# Patient Record
Sex: Female | Born: 1945 | Race: White | Hispanic: No | Marital: Married | Smoking: Never smoker
Health system: Southern US, Community
[De-identification: ages and names within clinical notes are randomized; demographics above are authoritative.]

## PROBLEM LIST (undated history)

## (undated) DIAGNOSIS — M1990 Unspecified osteoarthritis, unspecified site: Secondary | ICD-10-CM

## (undated) DIAGNOSIS — B019 Varicella without complication: Secondary | ICD-10-CM

## (undated) DIAGNOSIS — Z950 Presence of cardiac pacemaker: Secondary | ICD-10-CM

## (undated) DIAGNOSIS — R030 Elevated blood-pressure reading, without diagnosis of hypertension: Secondary | ICD-10-CM

## (undated) DIAGNOSIS — Z789 Other specified health status: Secondary | ICD-10-CM

## (undated) DIAGNOSIS — I441 Atrioventricular block, second degree: Secondary | ICD-10-CM

## (undated) DIAGNOSIS — N951 Menopausal and female climacteric states: Secondary | ICD-10-CM

## (undated) DIAGNOSIS — T7840XA Allergy, unspecified, initial encounter: Secondary | ICD-10-CM

## (undated) HISTORY — PX: OTHER SURGICAL HISTORY: SHX169

## (undated) HISTORY — PX: KNEE ARTHROSCOPY WITH MENISCAL REPAIR: SHX5653

## (undated) HISTORY — PX: APPENDECTOMY: SHX54

## (undated) HISTORY — PX: JOINT REPLACEMENT: SHX530

---

## 1987-07-10 HISTORY — PX: BREAST EXCISIONAL BIOPSY: SUR124

## 2005-01-18 ENCOUNTER — Ambulatory Visit: Payer: Self-pay | Admitting: Internal Medicine

## 2005-07-23 ENCOUNTER — Ambulatory Visit: Payer: Self-pay | Admitting: Internal Medicine

## 2006-03-05 ENCOUNTER — Ambulatory Visit: Payer: Self-pay | Admitting: Internal Medicine

## 2007-03-18 ENCOUNTER — Ambulatory Visit: Payer: Self-pay | Admitting: Internal Medicine

## 2007-09-17 ENCOUNTER — Ambulatory Visit: Payer: Self-pay | Admitting: Internal Medicine

## 2007-11-22 ENCOUNTER — Ambulatory Visit: Payer: Self-pay

## 2008-02-25 ENCOUNTER — Ambulatory Visit: Payer: Self-pay | Admitting: Orthopaedic Surgery

## 2008-02-27 ENCOUNTER — Ambulatory Visit: Payer: Self-pay | Admitting: Orthopaedic Surgery

## 2008-10-07 ENCOUNTER — Ambulatory Visit: Payer: Self-pay | Admitting: Internal Medicine

## 2009-10-12 ENCOUNTER — Ambulatory Visit: Payer: Self-pay | Admitting: Internal Medicine

## 2010-07-06 ENCOUNTER — Ambulatory Visit
Admission: RE | Admit: 2010-07-06 | Discharge: 2010-07-06 | Payer: Self-pay | Source: Home / Self Care | Attending: Family Medicine | Admitting: Family Medicine

## 2010-08-10 NOTE — Assessment & Plan Note (Signed)
Summary: flu shot/jbb  Nurse Visit   Immunizations Administered:  Influenza Vaccine:    Vaccine Type: FLULAVAL    Site: right deltoid    Mfr: GlaxoSmithKline    Dose: 0.5 ml    Route: IM    Given by: Levonne Spiller EMT-P    Exp. Date: 01/06/2011    Lot #: [id number]    VIS given: 01/31/10 version given July 06, 2010.   Immunizations Administered:  Influenza Vaccine:    Vaccine Type: FLULAVAL    Site: right deltoid    Mfr: GlaxoSmithKline    Dose: 0.5 ml    Route: IM    Given by: Levonne Spiller EMT-P    Exp. Date: 01/06/2011    Lot #: [id number]    VIS given: 01/31/10 version given July 06, 2010.  Flu Vaccine Consent Questions:    Do you have a history of severe allergic reactions to this vaccine? no    Any prior history of allergic reactions to egg and/or gelatin? no    Do you have a sensitivity to the preservative Thimersol? no    Do you have a past history of Guillan-Barre Syndrome? no    Do you currently have an acute febrile illness? no    Have you ever had a severe reaction to latex? no    Vaccine information given and explained to patient? yes    Are you currently pregnant? no

## 2010-11-23 ENCOUNTER — Ambulatory Visit: Payer: Self-pay | Admitting: Internal Medicine

## 2011-12-11 ENCOUNTER — Ambulatory Visit: Payer: Self-pay | Admitting: Internal Medicine

## 2011-12-14 ENCOUNTER — Ambulatory Visit: Payer: Self-pay | Admitting: Internal Medicine

## 2013-01-29 ENCOUNTER — Ambulatory Visit: Payer: Self-pay

## 2014-02-11 ENCOUNTER — Ambulatory Visit: Payer: Self-pay

## 2014-04-14 ENCOUNTER — Other Ambulatory Visit: Payer: Self-pay | Admitting: Orthopedic Surgery

## 2014-04-14 DIAGNOSIS — M25562 Pain in left knee: Secondary | ICD-10-CM

## 2014-04-26 ENCOUNTER — Ambulatory Visit
Admission: RE | Admit: 2014-04-26 | Discharge: 2014-04-26 | Disposition: A | Discharge status: Home / Self Care | Payer: Commercial Managed Care - HMO | Source: Ambulatory Visit | Attending: Orthopedic Surgery | Admitting: Orthopedic Surgery

## 2014-04-26 DIAGNOSIS — M25562 Pain in left knee: Secondary | ICD-10-CM

## 2014-08-18 DIAGNOSIS — G8918 Other acute postprocedural pain: Secondary | ICD-10-CM | POA: Diagnosis not present

## 2014-08-18 DIAGNOSIS — M1712 Unilateral primary osteoarthritis, left knee: Secondary | ICD-10-CM | POA: Diagnosis not present

## 2014-08-18 DIAGNOSIS — M179 Osteoarthritis of knee, unspecified: Secondary | ICD-10-CM | POA: Diagnosis not present

## 2014-08-19 DIAGNOSIS — M12869 Other specific arthropathies, not elsewhere classified, unspecified knee: Secondary | ICD-10-CM | POA: Diagnosis not present

## 2014-08-24 DIAGNOSIS — R262 Difficulty in walking, not elsewhere classified: Secondary | ICD-10-CM | POA: Diagnosis not present

## 2014-08-24 DIAGNOSIS — Z471 Aftercare following joint replacement surgery: Secondary | ICD-10-CM | POA: Diagnosis not present

## 2014-08-24 DIAGNOSIS — M25662 Stiffness of left knee, not elsewhere classified: Secondary | ICD-10-CM | POA: Diagnosis not present

## 2014-08-24 DIAGNOSIS — M6281 Muscle weakness (generalized): Secondary | ICD-10-CM | POA: Diagnosis not present

## 2014-08-24 DIAGNOSIS — Z96652 Presence of left artificial knee joint: Secondary | ICD-10-CM | POA: Diagnosis not present

## 2014-08-24 DIAGNOSIS — M25562 Pain in left knee: Secondary | ICD-10-CM | POA: Diagnosis not present

## 2014-08-26 DIAGNOSIS — R262 Difficulty in walking, not elsewhere classified: Secondary | ICD-10-CM | POA: Diagnosis not present

## 2014-08-26 DIAGNOSIS — M25662 Stiffness of left knee, not elsewhere classified: Secondary | ICD-10-CM | POA: Diagnosis not present

## 2014-08-26 DIAGNOSIS — M25562 Pain in left knee: Secondary | ICD-10-CM | POA: Diagnosis not present

## 2014-08-26 DIAGNOSIS — M6281 Muscle weakness (generalized): Secondary | ICD-10-CM | POA: Diagnosis not present

## 2014-08-30 DIAGNOSIS — M25662 Stiffness of left knee, not elsewhere classified: Secondary | ICD-10-CM | POA: Diagnosis not present

## 2014-08-30 DIAGNOSIS — M25562 Pain in left knee: Secondary | ICD-10-CM | POA: Diagnosis not present

## 2014-08-30 DIAGNOSIS — R262 Difficulty in walking, not elsewhere classified: Secondary | ICD-10-CM | POA: Diagnosis not present

## 2014-08-30 DIAGNOSIS — M6281 Muscle weakness (generalized): Secondary | ICD-10-CM | POA: Diagnosis not present

## 2014-09-01 DIAGNOSIS — M25562 Pain in left knee: Secondary | ICD-10-CM | POA: Diagnosis not present

## 2014-09-01 DIAGNOSIS — M25662 Stiffness of left knee, not elsewhere classified: Secondary | ICD-10-CM | POA: Diagnosis not present

## 2014-09-01 DIAGNOSIS — M6281 Muscle weakness (generalized): Secondary | ICD-10-CM | POA: Diagnosis not present

## 2014-09-01 DIAGNOSIS — R262 Difficulty in walking, not elsewhere classified: Secondary | ICD-10-CM | POA: Diagnosis not present

## 2014-09-02 DIAGNOSIS — R262 Difficulty in walking, not elsewhere classified: Secondary | ICD-10-CM | POA: Diagnosis not present

## 2014-09-02 DIAGNOSIS — M6281 Muscle weakness (generalized): Secondary | ICD-10-CM | POA: Diagnosis not present

## 2014-09-02 DIAGNOSIS — M25562 Pain in left knee: Secondary | ICD-10-CM | POA: Diagnosis not present

## 2014-09-02 DIAGNOSIS — M25662 Stiffness of left knee, not elsewhere classified: Secondary | ICD-10-CM | POA: Diagnosis not present

## 2014-09-07 DIAGNOSIS — Z96652 Presence of left artificial knee joint: Secondary | ICD-10-CM | POA: Diagnosis not present

## 2014-09-07 DIAGNOSIS — M25662 Stiffness of left knee, not elsewhere classified: Secondary | ICD-10-CM | POA: Diagnosis not present

## 2014-09-07 DIAGNOSIS — M25562 Pain in left knee: Secondary | ICD-10-CM | POA: Diagnosis not present

## 2014-09-07 DIAGNOSIS — M6281 Muscle weakness (generalized): Secondary | ICD-10-CM | POA: Diagnosis not present

## 2014-09-07 DIAGNOSIS — R262 Difficulty in walking, not elsewhere classified: Secondary | ICD-10-CM | POA: Diagnosis not present

## 2014-09-08 DIAGNOSIS — M6281 Muscle weakness (generalized): Secondary | ICD-10-CM | POA: Diagnosis not present

## 2014-09-08 DIAGNOSIS — Z96652 Presence of left artificial knee joint: Secondary | ICD-10-CM | POA: Diagnosis not present

## 2014-09-08 DIAGNOSIS — R262 Difficulty in walking, not elsewhere classified: Secondary | ICD-10-CM | POA: Diagnosis not present

## 2014-09-08 DIAGNOSIS — M25662 Stiffness of left knee, not elsewhere classified: Secondary | ICD-10-CM | POA: Diagnosis not present

## 2014-09-08 DIAGNOSIS — M25562 Pain in left knee: Secondary | ICD-10-CM | POA: Diagnosis not present

## 2014-09-09 DIAGNOSIS — M25662 Stiffness of left knee, not elsewhere classified: Secondary | ICD-10-CM | POA: Diagnosis not present

## 2014-09-09 DIAGNOSIS — R262 Difficulty in walking, not elsewhere classified: Secondary | ICD-10-CM | POA: Diagnosis not present

## 2014-09-09 DIAGNOSIS — M25562 Pain in left knee: Secondary | ICD-10-CM | POA: Diagnosis not present

## 2014-09-09 DIAGNOSIS — M6281 Muscle weakness (generalized): Secondary | ICD-10-CM | POA: Diagnosis not present

## 2014-09-14 DIAGNOSIS — R262 Difficulty in walking, not elsewhere classified: Secondary | ICD-10-CM | POA: Diagnosis not present

## 2014-09-14 DIAGNOSIS — Z96652 Presence of left artificial knee joint: Secondary | ICD-10-CM | POA: Diagnosis not present

## 2014-09-14 DIAGNOSIS — M25662 Stiffness of left knee, not elsewhere classified: Secondary | ICD-10-CM | POA: Diagnosis not present

## 2014-09-14 DIAGNOSIS — M25562 Pain in left knee: Secondary | ICD-10-CM | POA: Diagnosis not present

## 2014-09-16 DIAGNOSIS — M6281 Muscle weakness (generalized): Secondary | ICD-10-CM | POA: Diagnosis not present

## 2014-09-16 DIAGNOSIS — M25662 Stiffness of left knee, not elsewhere classified: Secondary | ICD-10-CM | POA: Diagnosis not present

## 2014-09-16 DIAGNOSIS — R262 Difficulty in walking, not elsewhere classified: Secondary | ICD-10-CM | POA: Diagnosis not present

## 2014-09-16 DIAGNOSIS — M25562 Pain in left knee: Secondary | ICD-10-CM | POA: Diagnosis not present

## 2014-11-09 DIAGNOSIS — N952 Postmenopausal atrophic vaginitis: Secondary | ICD-10-CM | POA: Diagnosis not present

## 2014-11-09 DIAGNOSIS — M810 Age-related osteoporosis without current pathological fracture: Secondary | ICD-10-CM | POA: Diagnosis not present

## 2014-11-09 DIAGNOSIS — R030 Elevated blood-pressure reading, without diagnosis of hypertension: Secondary | ICD-10-CM | POA: Diagnosis not present

## 2014-12-21 DIAGNOSIS — Z96652 Presence of left artificial knee joint: Secondary | ICD-10-CM | POA: Diagnosis not present

## 2015-04-15 DIAGNOSIS — Z23 Encounter for immunization: Secondary | ICD-10-CM | POA: Diagnosis not present

## 2015-05-05 DIAGNOSIS — Z0000 Encounter for general adult medical examination without abnormal findings: Secondary | ICD-10-CM | POA: Diagnosis not present

## 2015-05-05 DIAGNOSIS — R030 Elevated blood-pressure reading, without diagnosis of hypertension: Secondary | ICD-10-CM | POA: Diagnosis not present

## 2015-05-06 ENCOUNTER — Other Ambulatory Visit: Payer: Self-pay | Admitting: Infectious Diseases

## 2015-05-06 ENCOUNTER — Other Ambulatory Visit: Payer: Self-pay | Admitting: Internal Medicine

## 2015-05-06 DIAGNOSIS — Z1231 Encounter for screening mammogram for malignant neoplasm of breast: Secondary | ICD-10-CM

## 2015-05-12 DIAGNOSIS — Z1239 Encounter for other screening for malignant neoplasm of breast: Secondary | ICD-10-CM | POA: Diagnosis not present

## 2015-05-12 DIAGNOSIS — Z0000 Encounter for general adult medical examination without abnormal findings: Secondary | ICD-10-CM | POA: Diagnosis not present

## 2015-05-12 DIAGNOSIS — M810 Age-related osteoporosis without current pathological fracture: Secondary | ICD-10-CM | POA: Diagnosis not present

## 2015-05-17 DIAGNOSIS — H5210 Myopia, unspecified eye: Secondary | ICD-10-CM | POA: Diagnosis not present

## 2015-05-17 DIAGNOSIS — H2513 Age-related nuclear cataract, bilateral: Secondary | ICD-10-CM | POA: Diagnosis not present

## 2015-05-17 DIAGNOSIS — H5200 Hypermetropia, unspecified eye: Secondary | ICD-10-CM | POA: Diagnosis not present

## 2015-05-19 ENCOUNTER — Other Ambulatory Visit: Payer: Self-pay | Admitting: Internal Medicine

## 2015-05-19 ENCOUNTER — Ambulatory Visit
Admission: RE | Admit: 2015-05-19 | Discharge: 2015-05-19 | Disposition: A | Discharge status: Home / Self Care | Payer: Commercial Managed Care - HMO | Source: Ambulatory Visit | Attending: Internal Medicine | Admitting: Internal Medicine

## 2015-05-19 DIAGNOSIS — Z1231 Encounter for screening mammogram for malignant neoplasm of breast: Secondary | ICD-10-CM | POA: Diagnosis not present

## 2015-05-27 DIAGNOSIS — Z0000 Encounter for general adult medical examination without abnormal findings: Secondary | ICD-10-CM | POA: Diagnosis not present

## 2015-08-16 DIAGNOSIS — Z96652 Presence of left artificial knee joint: Secondary | ICD-10-CM | POA: Diagnosis not present

## 2015-08-16 DIAGNOSIS — M25462 Effusion, left knee: Secondary | ICD-10-CM | POA: Diagnosis not present

## 2015-08-16 DIAGNOSIS — Z471 Aftercare following joint replacement surgery: Secondary | ICD-10-CM | POA: Diagnosis not present

## 2015-11-10 DIAGNOSIS — M810 Age-related osteoporosis without current pathological fracture: Secondary | ICD-10-CM | POA: Diagnosis not present

## 2015-11-10 DIAGNOSIS — Z13220 Encounter for screening for lipoid disorders: Secondary | ICD-10-CM | POA: Diagnosis not present

## 2016-04-04 ENCOUNTER — Other Ambulatory Visit: Payer: Self-pay | Admitting: Internal Medicine

## 2016-04-04 DIAGNOSIS — Z1231 Encounter for screening mammogram for malignant neoplasm of breast: Secondary | ICD-10-CM

## 2016-05-04 IMAGING — MG MM SCREENING BREAST TOMO BILATERAL
9 of 12 series · 9 of 28 positions shown · non-contrast
Comparison: Previous exam(s).

CLINICAL DATA: Screening.

EXAM:
DIGITAL SCREENING BILATERAL MAMMOGRAM WITH 3D TOMO WITH CAD

[L MLO]
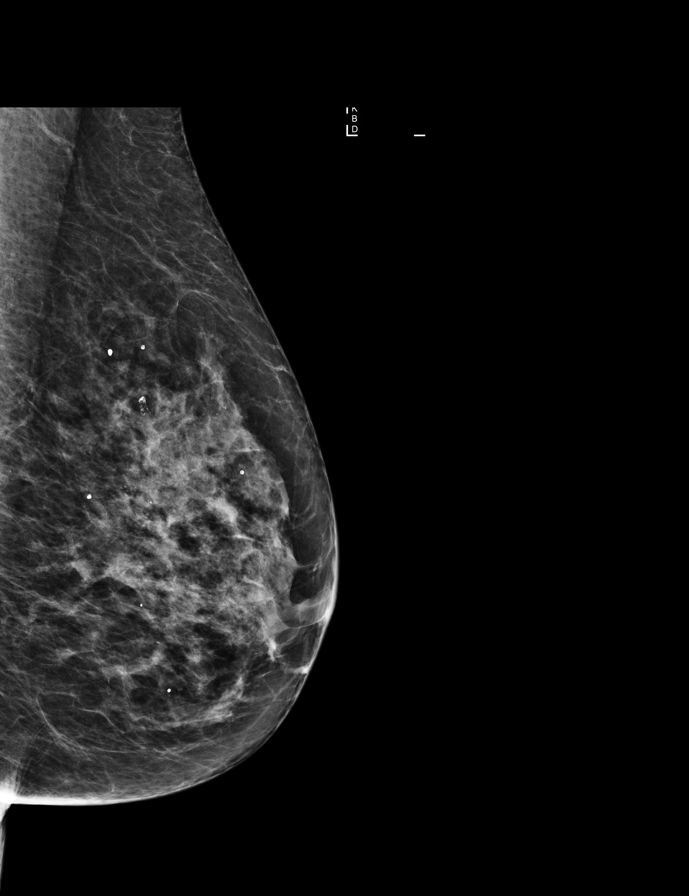

[R CC synth-2D]
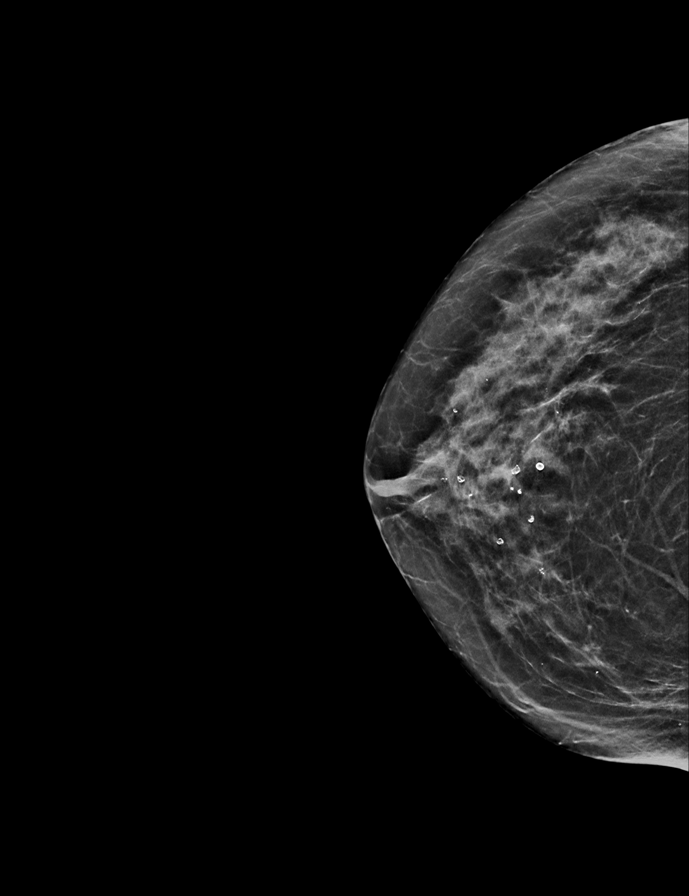

[L CC synth-2D]
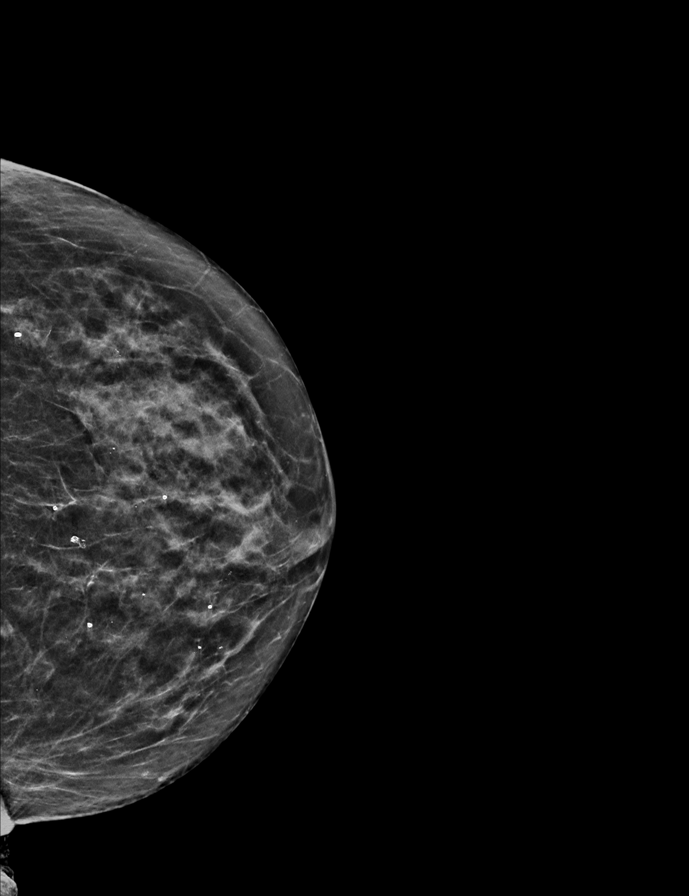

[L MLO synth-2D]
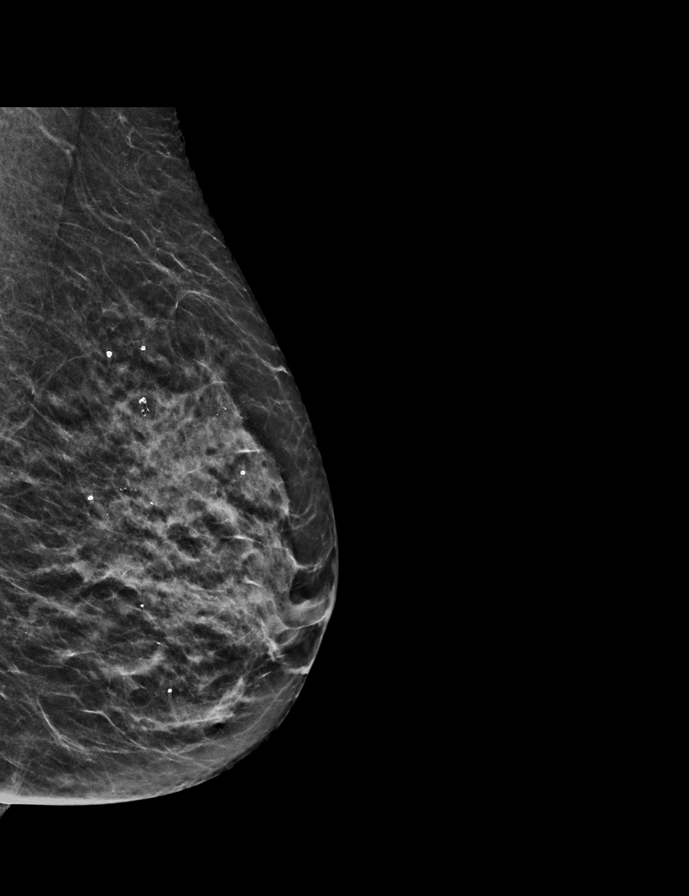

[R MLO synth-2D]
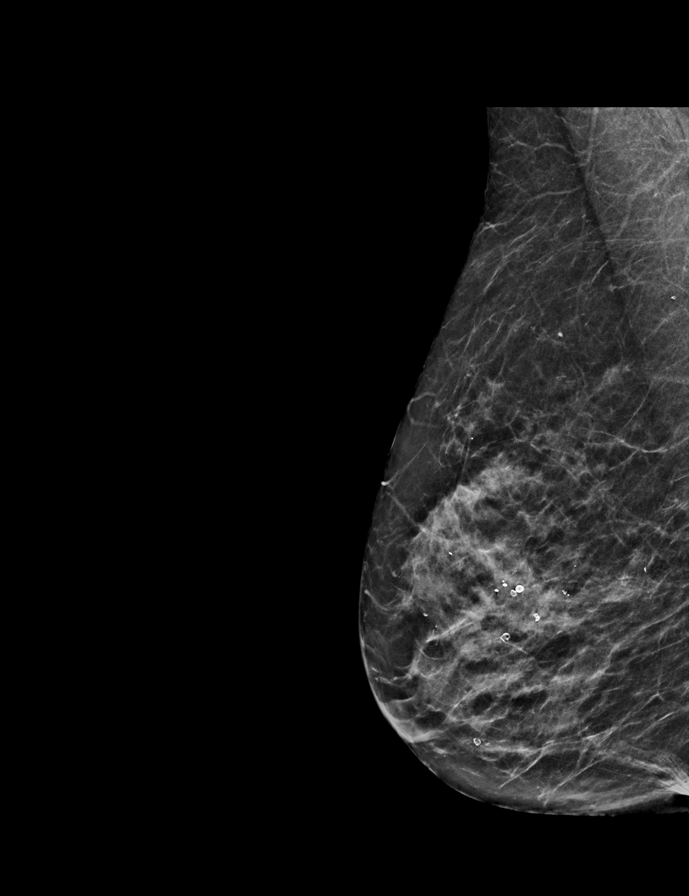

[L CC]
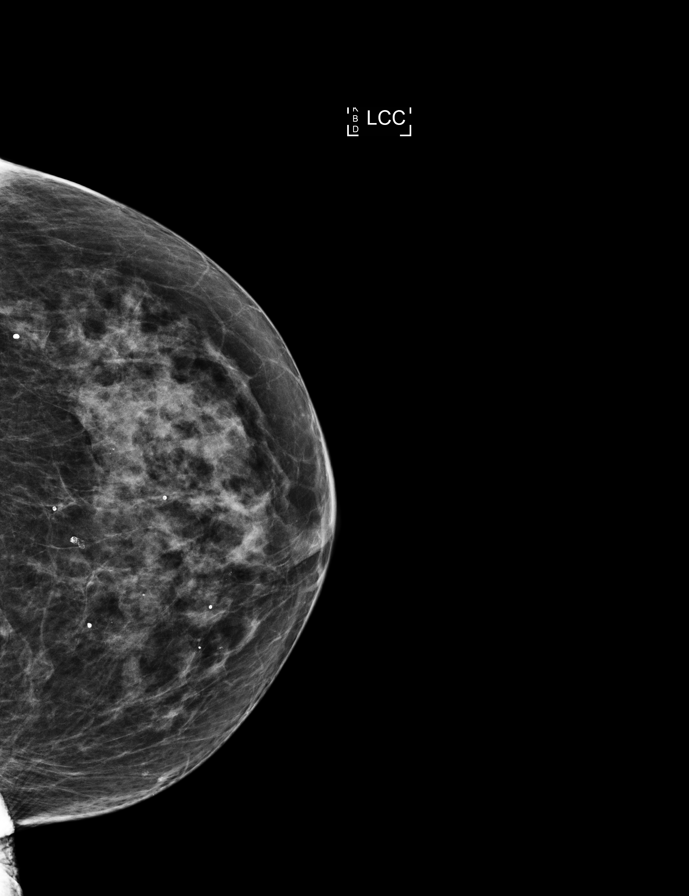

[R CC]
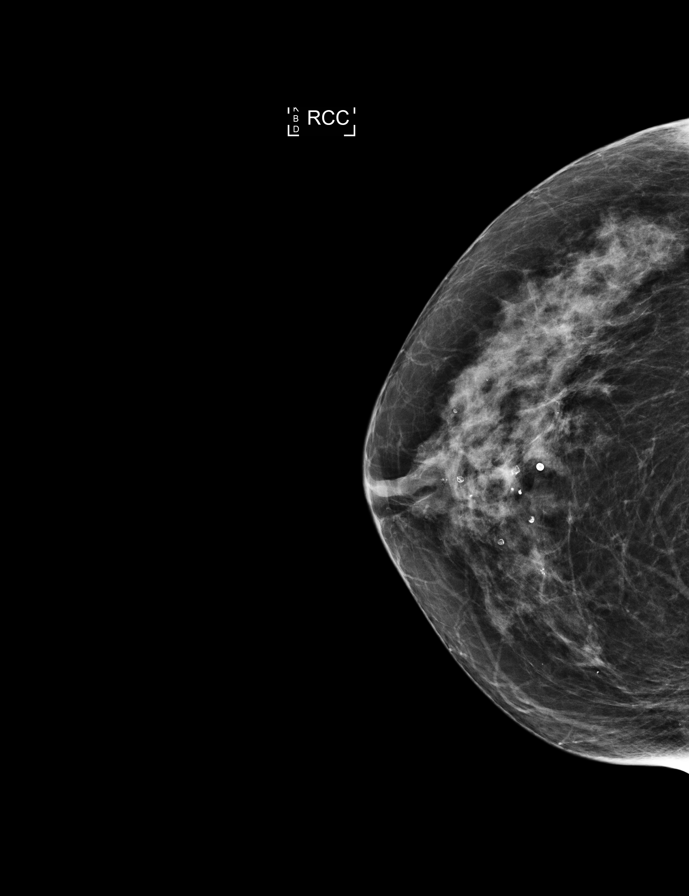

[R MLO]
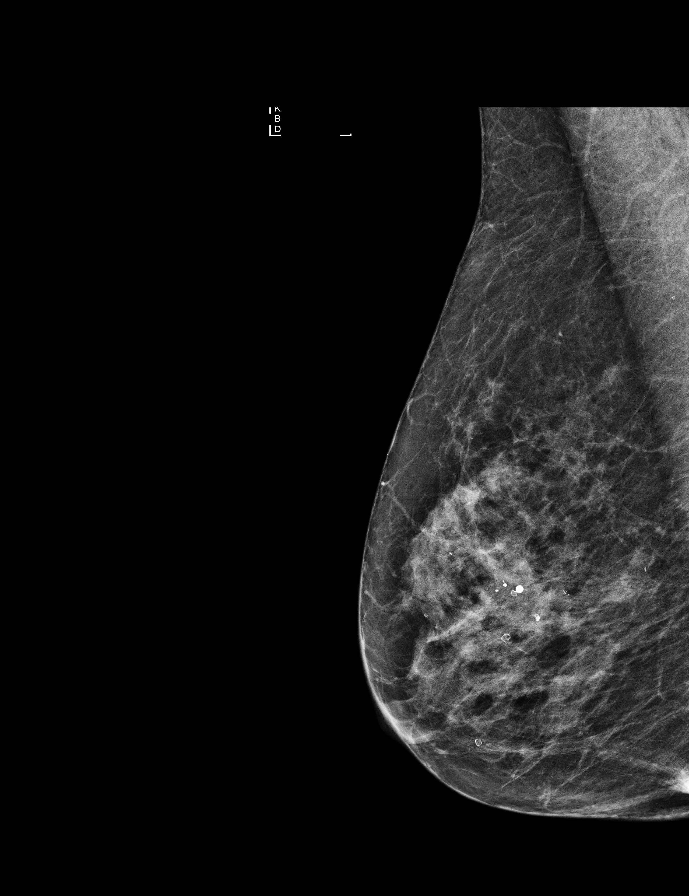

[L MLO tomo · tomo slice 21/42.0]
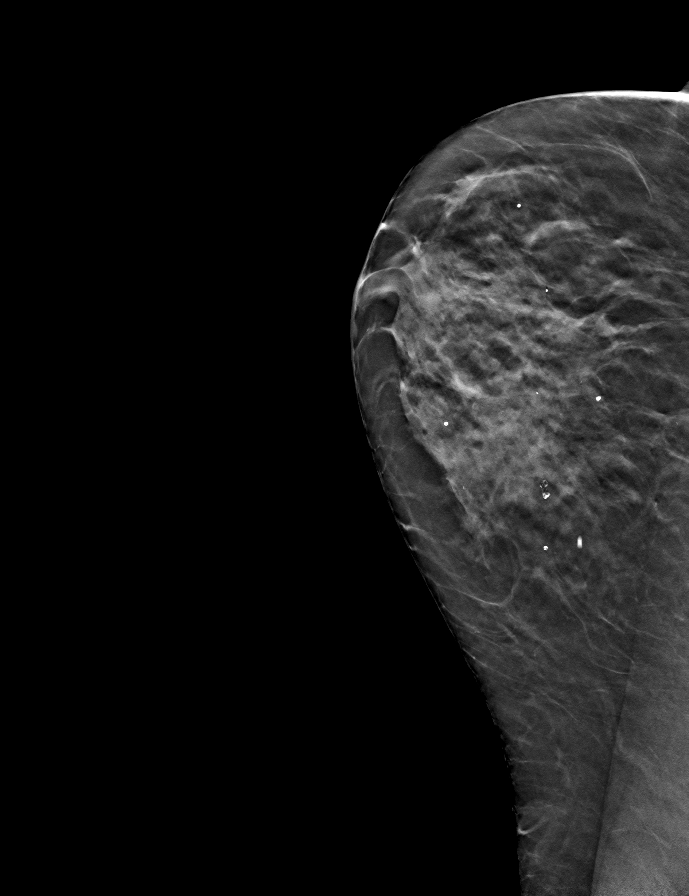

[9 of 28 positions shown; findings below may reference images not displayed]

ACR Breast Density Category c: The breast tissue is heterogeneously
dense, which may obscure small masses.
FINDINGS: There are no findings suspicious for malignancy. Images were
processed with CAD.
IMPRESSION: No mammographic evidence of malignancy. A result letter of this
screening mammogram will be mailed directly to the patient.

RECOMMENDATION:
Screening mammogram in one year. (Code:OA-G-1SS)

BI-RADS CATEGORY  1: Negative.

## 2016-05-10 DIAGNOSIS — Z13220 Encounter for screening for lipoid disorders: Secondary | ICD-10-CM | POA: Diagnosis not present

## 2016-05-10 DIAGNOSIS — M810 Age-related osteoporosis without current pathological fracture: Secondary | ICD-10-CM | POA: Diagnosis not present

## 2016-05-17 DIAGNOSIS — Z0000 Encounter for general adult medical examination without abnormal findings: Secondary | ICD-10-CM | POA: Diagnosis not present

## 2016-05-17 DIAGNOSIS — Z23 Encounter for immunization: Secondary | ICD-10-CM | POA: Diagnosis not present

## 2016-05-17 DIAGNOSIS — Z1211 Encounter for screening for malignant neoplasm of colon: Secondary | ICD-10-CM | POA: Diagnosis not present

## 2016-05-17 DIAGNOSIS — M810 Age-related osteoporosis without current pathological fracture: Secondary | ICD-10-CM | POA: Diagnosis not present

## 2016-05-17 DIAGNOSIS — Z124 Encounter for screening for malignant neoplasm of cervix: Secondary | ICD-10-CM | POA: Diagnosis not present

## 2016-05-24 ENCOUNTER — Ambulatory Visit
Admission: RE | Admit: 2016-05-24 | Discharge: 2016-05-24 | Disposition: A | Discharge status: Home / Self Care | Payer: Commercial Managed Care - HMO | Source: Ambulatory Visit | Attending: Internal Medicine | Admitting: Internal Medicine

## 2016-05-24 DIAGNOSIS — Z1231 Encounter for screening mammogram for malignant neoplasm of breast: Secondary | ICD-10-CM | POA: Insufficient documentation

## 2016-06-04 DIAGNOSIS — M810 Age-related osteoporosis without current pathological fracture: Secondary | ICD-10-CM | POA: Diagnosis not present

## 2016-06-04 DIAGNOSIS — Z0000 Encounter for general adult medical examination without abnormal findings: Secondary | ICD-10-CM | POA: Diagnosis not present

## 2016-06-04 DIAGNOSIS — Z1211 Encounter for screening for malignant neoplasm of colon: Secondary | ICD-10-CM | POA: Diagnosis not present

## 2016-06-04 DIAGNOSIS — Z124 Encounter for screening for malignant neoplasm of cervix: Secondary | ICD-10-CM | POA: Diagnosis not present

## 2016-06-04 DIAGNOSIS — Z23 Encounter for immunization: Secondary | ICD-10-CM | POA: Diagnosis not present

## 2016-06-06 DIAGNOSIS — M810 Age-related osteoporosis without current pathological fracture: Secondary | ICD-10-CM | POA: Diagnosis not present

## 2016-08-16 DIAGNOSIS — Z0000 Encounter for general adult medical examination without abnormal findings: Secondary | ICD-10-CM | POA: Diagnosis not present

## 2016-08-16 DIAGNOSIS — R030 Elevated blood-pressure reading, without diagnosis of hypertension: Secondary | ICD-10-CM | POA: Diagnosis not present

## 2016-08-16 DIAGNOSIS — M810 Age-related osteoporosis without current pathological fracture: Secondary | ICD-10-CM | POA: Diagnosis not present

## 2016-09-20 DIAGNOSIS — H2513 Age-related nuclear cataract, bilateral: Secondary | ICD-10-CM | POA: Diagnosis not present

## 2016-09-20 DIAGNOSIS — Z0100 Encounter for examination of eyes and vision without abnormal findings: Secondary | ICD-10-CM | POA: Diagnosis not present

## 2016-11-29 DIAGNOSIS — M810 Age-related osteoporosis without current pathological fracture: Secondary | ICD-10-CM | POA: Diagnosis not present

## 2016-11-29 DIAGNOSIS — R030 Elevated blood-pressure reading, without diagnosis of hypertension: Secondary | ICD-10-CM | POA: Diagnosis not present

## 2016-11-29 DIAGNOSIS — Z131 Encounter for screening for diabetes mellitus: Secondary | ICD-10-CM | POA: Diagnosis not present

## 2017-04-10 ENCOUNTER — Other Ambulatory Visit: Payer: Self-pay | Admitting: Internal Medicine

## 2017-04-10 DIAGNOSIS — Z1231 Encounter for screening mammogram for malignant neoplasm of breast: Secondary | ICD-10-CM

## 2017-05-27 ENCOUNTER — Ambulatory Visit
Admission: RE | Admit: 2017-05-27 | Discharge: 2017-05-27 | Disposition: A | Discharge status: Home / Self Care | Payer: Commercial Managed Care - HMO | Source: Ambulatory Visit | Attending: Internal Medicine | Admitting: Internal Medicine

## 2017-05-27 DIAGNOSIS — R030 Elevated blood-pressure reading, without diagnosis of hypertension: Secondary | ICD-10-CM | POA: Diagnosis not present

## 2017-05-27 DIAGNOSIS — M810 Age-related osteoporosis without current pathological fracture: Secondary | ICD-10-CM | POA: Diagnosis not present

## 2017-05-27 DIAGNOSIS — Z1231 Encounter for screening mammogram for malignant neoplasm of breast: Secondary | ICD-10-CM

## 2017-05-27 DIAGNOSIS — Z131 Encounter for screening for diabetes mellitus: Secondary | ICD-10-CM | POA: Diagnosis not present

## 2017-06-03 DIAGNOSIS — Z0000 Encounter for general adult medical examination without abnormal findings: Secondary | ICD-10-CM | POA: Diagnosis not present

## 2017-07-15 DIAGNOSIS — H52223 Regular astigmatism, bilateral: Secondary | ICD-10-CM | POA: Diagnosis not present

## 2017-07-15 DIAGNOSIS — H524 Presbyopia: Secondary | ICD-10-CM | POA: Diagnosis not present

## 2017-07-15 DIAGNOSIS — H5203 Hypermetropia, bilateral: Secondary | ICD-10-CM | POA: Diagnosis not present

## 2017-07-15 DIAGNOSIS — H2513 Age-related nuclear cataract, bilateral: Secondary | ICD-10-CM | POA: Diagnosis not present

## 2017-07-16 DIAGNOSIS — Z0100 Encounter for examination of eyes and vision without abnormal findings: Secondary | ICD-10-CM | POA: Diagnosis not present

## 2018-04-14 ENCOUNTER — Other Ambulatory Visit: Payer: Self-pay | Admitting: Internal Medicine

## 2018-04-14 DIAGNOSIS — Z1231 Encounter for screening mammogram for malignant neoplasm of breast: Secondary | ICD-10-CM

## 2018-05-29 ENCOUNTER — Ambulatory Visit
Admission: RE | Admit: 2018-05-29 | Discharge: 2018-05-29 | Disposition: A | Discharge status: Home / Self Care | Payer: Medicare HMO | Source: Ambulatory Visit | Attending: Internal Medicine | Admitting: Internal Medicine

## 2018-05-29 DIAGNOSIS — Z1231 Encounter for screening mammogram for malignant neoplasm of breast: Secondary | ICD-10-CM | POA: Insufficient documentation

## 2018-07-07 DIAGNOSIS — R030 Elevated blood-pressure reading, without diagnosis of hypertension: Secondary | ICD-10-CM | POA: Diagnosis not present

## 2018-07-07 DIAGNOSIS — Z0000 Encounter for general adult medical examination without abnormal findings: Secondary | ICD-10-CM | POA: Diagnosis not present

## 2018-07-07 DIAGNOSIS — Z13220 Encounter for screening for lipoid disorders: Secondary | ICD-10-CM | POA: Diagnosis not present

## 2018-07-14 DIAGNOSIS — Z23 Encounter for immunization: Secondary | ICD-10-CM | POA: Diagnosis not present

## 2018-07-14 DIAGNOSIS — R030 Elevated blood-pressure reading, without diagnosis of hypertension: Secondary | ICD-10-CM | POA: Diagnosis not present

## 2018-07-14 DIAGNOSIS — Z0000 Encounter for general adult medical examination without abnormal findings: Secondary | ICD-10-CM | POA: Diagnosis not present

## 2018-08-14 DIAGNOSIS — R030 Elevated blood-pressure reading, without diagnosis of hypertension: Secondary | ICD-10-CM | POA: Diagnosis not present

## 2018-08-29 DIAGNOSIS — H5203 Hypermetropia, bilateral: Secondary | ICD-10-CM | POA: Diagnosis not present

## 2018-08-29 DIAGNOSIS — H524 Presbyopia: Secondary | ICD-10-CM | POA: Diagnosis not present

## 2018-08-29 DIAGNOSIS — H2513 Age-related nuclear cataract, bilateral: Secondary | ICD-10-CM | POA: Diagnosis not present

## 2018-12-16 ENCOUNTER — Inpatient Hospital Stay
Admit: 2018-12-16 | Discharge: 2018-12-16 | Disposition: A | Discharge status: Home / Self Care | Payer: Medicare HMO | Attending: Cardiovascular Disease | Admitting: Cardiovascular Disease

## 2018-12-16 ENCOUNTER — Inpatient Hospital Stay
Admission: EM | Admit: 2018-12-16 | Discharge: 2018-12-19 | DRG: 244 | Disposition: A | Discharge status: Home / Self Care | Payer: Medicare HMO | Attending: Internal Medicine | Admitting: Internal Medicine

## 2018-12-16 ENCOUNTER — Encounter: Payer: Self-pay | Admitting: Medical Oncology

## 2018-12-16 ENCOUNTER — Other Ambulatory Visit: Payer: Self-pay

## 2018-12-16 DIAGNOSIS — I459 Conduction disorder, unspecified: Secondary | ICD-10-CM

## 2018-12-16 DIAGNOSIS — M1990 Unspecified osteoarthritis, unspecified site: Secondary | ICD-10-CM | POA: Diagnosis present

## 2018-12-16 DIAGNOSIS — Z9861 Coronary angioplasty status: Secondary | ICD-10-CM | POA: Diagnosis not present

## 2018-12-16 DIAGNOSIS — I441 Atrioventricular block, second degree: Secondary | ICD-10-CM | POA: Diagnosis not present

## 2018-12-16 DIAGNOSIS — Z882 Allergy status to sulfonamides status: Secondary | ICD-10-CM

## 2018-12-16 DIAGNOSIS — R918 Other nonspecific abnormal finding of lung field: Secondary | ICD-10-CM | POA: Diagnosis not present

## 2018-12-16 DIAGNOSIS — Z950 Presence of cardiac pacemaker: Secondary | ICD-10-CM

## 2018-12-16 DIAGNOSIS — Z03818 Encounter for observation for suspected exposure to other biological agents ruled out: Secondary | ICD-10-CM | POA: Diagnosis not present

## 2018-12-16 DIAGNOSIS — R030 Elevated blood-pressure reading, without diagnosis of hypertension: Secondary | ICD-10-CM | POA: Diagnosis not present

## 2018-12-16 DIAGNOSIS — Z1159 Encounter for screening for other viral diseases: Secondary | ICD-10-CM

## 2018-12-16 DIAGNOSIS — M810 Age-related osteoporosis without current pathological fracture: Secondary | ICD-10-CM | POA: Diagnosis not present

## 2018-12-16 DIAGNOSIS — I499 Cardiac arrhythmia, unspecified: Secondary | ICD-10-CM | POA: Diagnosis not present

## 2018-12-16 DIAGNOSIS — I10 Essential (primary) hypertension: Secondary | ICD-10-CM | POA: Diagnosis not present

## 2018-12-16 DIAGNOSIS — R0989 Other specified symptoms and signs involving the circulatory and respiratory systems: Secondary | ICD-10-CM | POA: Diagnosis not present

## 2018-12-16 DIAGNOSIS — R531 Weakness: Secondary | ICD-10-CM | POA: Diagnosis not present

## 2018-12-16 DIAGNOSIS — R9431 Abnormal electrocardiogram [ECG] [EKG]: Secondary | ICD-10-CM | POA: Diagnosis not present

## 2018-12-16 HISTORY — DX: Other specified health status: Z78.9

## 2018-12-16 LAB — CBC
HCT: 45.2 % (ref 36.0–46.0)
Hemoglobin: 15.3 g/dL — ABNORMAL HIGH (ref 12.0–15.0)
MCH: 32 pg (ref 26.0–34.0)
MCHC: 33.8 g/dL (ref 30.0–36.0)
MCV: 94.6 fL (ref 80.0–100.0)
Platelets: 320 10*3/uL (ref 150–400)
RBC: 4.78 MIL/uL (ref 3.87–5.11)
RDW: 11.8 % (ref 11.5–15.5)
WBC: 10.3 10*3/uL (ref 4.0–10.5)
nRBC: 0 % (ref 0.0–0.2)

## 2018-12-16 LAB — BASIC METABOLIC PANEL
Anion gap: 10 (ref 5–15)
BUN: 15 mg/dL (ref 8–23)
CO2: 25 mmol/L (ref 22–32)
Calcium: 9.5 mg/dL (ref 8.9–10.3)
Chloride: 106 mmol/L (ref 98–111)
Creatinine, Ser: 0.77 mg/dL (ref 0.44–1.00)
GFR calc Af Amer: >60 mL/min (ref >60)
GFR calc non Af Amer: >60 mL/min (ref >60)
Glucose, Bld: 112 mg/dL — ABNORMAL HIGH (ref 70–99)
Potassium: 4 mmol/L (ref 3.5–5.1)
Sodium: 141 mmol/L (ref 135–145)

## 2018-12-16 LAB — TROPONIN I
Troponin I: <0.03 ng/mL (ref <0.03)
Troponin I: <0.03 ng/mL (ref <0.03)

## 2018-12-16 LAB — MAGNESIUM: Magnesium: 2.5 mg/dL — ABNORMAL HIGH (ref 1.7–2.4)

## 2018-12-16 LAB — TSH: TSH: 2.946 u[IU]/mL (ref 0.350–4.500)

## 2018-12-16 MED ORDER — ALBUTEROL SULFATE (2.5 MG/3ML) 0.083% IN NEBU: 2.5000 mg | INHALATION_SOLUTION | Freq: Four times a day (QID) | RESPIRATORY_TRACT | Status: DC | PRN

## 2018-12-16 MED ORDER — ALBUTEROL SULFATE (2.5 MG/3ML) 0.083% IN NEBU
2.5000 mg | INHALATION_SOLUTION | Freq: Four times a day (QID) | RESPIRATORY_TRACT | Status: DC
  Filled 2018-12-16: qty 3

## 2018-12-16 MED ORDER — ACETAMINOPHEN 325 MG PO TABS: 650.0000 mg | ORAL_TABLET | Freq: Four times a day (QID) | ORAL | Status: DC | PRN

## 2018-12-16 MED ORDER — SODIUM CHLORIDE 0.9% FLUSH
3.0000 mL | Freq: Two times a day (BID) | INTRAVENOUS | Status: DC
  Administered 2018-12-16 – 2018-12-18 (×5): 3 mL via INTRAVENOUS

## 2018-12-16 MED ORDER — NITROGLYCERIN 0.4 MG SL SUBL: 0.4000 mg | SUBLINGUAL_TABLET | SUBLINGUAL | Status: DC | PRN

## 2018-12-16 MED ORDER — SODIUM CHLORIDE 0.9 % IV SOLN
250.0000 mL | INTRAVENOUS | Status: DC | PRN
  Administered 2018-12-18: 12:00:00 via INTRAVENOUS
  Administered 2018-12-19 (×2): 250 mL via INTRAVENOUS

## 2018-12-16 MED ORDER — SODIUM CHLORIDE 0.9% FLUSH: 3.0000 mL | Freq: Once | INTRAVENOUS | Status: DC

## 2018-12-16 MED ORDER — ACETAMINOPHEN 650 MG RE SUPP: 650.0000 mg | Freq: Four times a day (QID) | RECTAL | Status: DC | PRN

## 2018-12-16 MED ORDER — ENOXAPARIN SODIUM 40 MG/0.4ML ~~LOC~~ SOLN
40.0000 mg | SUBCUTANEOUS | Status: DC
  Administered 2018-12-16 – 2018-12-18 (×3): 40 mg via SUBCUTANEOUS
  Filled 2018-12-16 (×3): qty 0.4

## 2018-12-16 MED ORDER — POLYETHYLENE GLYCOL 3350 17 G PO PACK: 17.0000 g | PACK | Freq: Every day | ORAL | Status: DC | PRN

## 2018-12-16 MED ORDER — SODIUM CHLORIDE 0.9% FLUSH: 3.0000 mL | INTRAVENOUS | Status: DC | PRN

## 2018-12-16 NOTE — ED Provider Notes (Signed)
[facility] Emergency Department Provider Note ____________________________________________   First MD Initiated Contact with Patient 12/16/18 1508     (approximate)  I have reviewed the triage vital signs and the nursing notes.  HISTORY  Chief Complaint Irregular Heart Beat  HPI LELER BRION is a 73 y.o. female here for evaluation for feeling weak  Since Sunday has been feeling tired.  Noticed on her heart monitor at home that her heart rates been running low.  She feels like her heartbeat will be irregular at times and she will feel tired.  Despite this she was able to mow the lawn today without issue, except just felt more tired than normal.  No feel like she is got a pass out.  No nausea vomiting.  No chest pain no trouble breathing.  No recent infection.  No cough no fevers no chills.  She feels well except when the heart feels irregular she feels weak   No personal history heart disease  History reviewed. No pertinent past medical history.  There are no active problems to display for this patient.   Past Surgical History:  Procedure Laterality Date  . BREAST EXCISIONAL BIOPSY Right 1989    Prior to Admission medications   Not on File    Allergies Sulfa antibiotics  Family History  Problem Relation Age of Onset  . Breast cancer Neg Hx     Social History Social History   Tobacco Use  . Smoking status: Not on file  Substance Use Topics  . Alcohol use: Not on file  . Drug use: Not on file  No illicit drug use Non-smoker  Review of Systems Constitutional: No fever/chills Eyes: No visual changes. ENT: No sore throat. Cardiovascular: Denies chest pain. Respiratory: Denies shortness of breath. Gastrointestinal: No abdominal pain.   Genitourinary: Negative for dysuria. Musculoskeletal: Negative for back pain. Skin: Negative for rash. Neurological: Negative for headaches, areas of focal weakness or numbness.     ____________________________________________   PHYSICAL EXAM:  VITAL SIGNS: ED Triage Vitals [12/16/18 1428]  Enc Vitals Group     BP (!) 176/62     Pulse Rate 79     Resp 16     Temp 98.5 F (36.9 C)     Temp Source Oral     SpO2 97 %     Weight 109 lb (49.4 kg)     Height 4\' 11"  (1.499 m)     Head Circumference      Peak Flow      Pain Score 0     Pain Loc      Pain Edu?      Excl. in [place]?     Constitutional: Alert and oriented. Well appearing and in no acute distress. Eyes: Conjunctivae are normal. Head: Atraumatic. Nose: No congestion/rhinnorhea. Mouth/Throat: Mucous membranes are moist. Neck: No stridor.  Cardiovascular: Slightly irregular rate, irregular rhythm. Grossly normal heart sounds.  Good peripheral circulation.  Heart rate approximately 40 by palpation, electrically approximately 70 Respiratory: Normal respiratory effort.  No retractions. Lungs CTAB. Gastrointestinal: Soft and nontender. No distention. Musculoskeletal: No lower extremity tenderness nor edema. Neurologic:  Normal speech and language. No gross focal neurologic deficits are appreciated.  Skin:  Skin is warm, dry and intact. No rash noted. Psychiatric: Mood and affect are normal. Speech and behavior are normal.  ____________________________________________   LABS (all labs ordered are listed, but only abnormal results are displayed)  Labs Reviewed  BASIC METABOLIC PANEL - Abnormal; Notable  for the following components:      Result Value   Glucose, Bld 112 (*)    All other components within normal limits  CBC - Abnormal; Notable for the following components:   Hemoglobin 15.3 (*)    All other components within normal limits  NOVEL CORONAVIRUS, NAA (HOSPITAL ORDER, SEND-OUT TO REF LAB)  TROPONIN I   ____________________________________________  EKG  Reviewed entered by me at 1430 Heart rate 89 QRS 110 QTc 420 Sinus beats followed by PAC, then PVCs as well.  Second-degree type II  heart block based on conversation with Dr. Park BreedKahn of cardiology and also review of rhythm strips ____________________________________________  RADIOLOGY  No results found.  ____________________________________________   PROCEDURES  Procedure(s) performed: None  Procedures  Critical Care performed: No  ____________________________________________   INITIAL IMPRESSION / ASSESSMENT AND PLAN / ED COURSE  Pertinent labs & imaging results that were available during my care of the patient were reviewed by me and considered in my medical decision making (see chart for details).   Here for evaluation of low heart rate.  Stable, normal blood pressure slightly elevated, fully alert.  Asymptomatic at rest.  However, review of EKG and discussion with cardiology there is a high concern for a high degree heart block including second-degree type II.  Patient agreeable understanding of plan for admission to the hospital, monitored on telemetry, consult by Dr. Lennette BihariKohn of cardiology.  Hospitalist Dr. Katheren ShamsSalary admitting      ____________________________________________   FINAL CLINICAL IMPRESSION(S) / ED DIAGNOSES  Final diagnoses:  Heart block  Mobitz type 2 second degree atrioventricular block        Note:  This document was prepared using Dragon voice recognition software and may include unintentional dictation errors       Sharyn CreamerQuale, , MD 12/16/18 1547

## 2018-12-16 NOTE — ED Notes (Signed)
Attempted to call report. Nurse on floor unavailable. 

## 2018-12-16 NOTE — ED Notes (Signed)
Green and purple top tubes sent to lab. 

## 2018-12-16 NOTE — Progress Notes (Signed)
Patient declined scheduled breathing treatment. States she has never had them before and does not want them. Per Respiratory Assessment, Patient scored level 1, Frequency changed to Q6 PRN. Patient resting comfortably in bed on room air, SAT 97%, clear bilateral breath sounds. Will continue to monitor.

## 2018-12-16 NOTE — ED Triage Notes (Signed)
Pt from [facility] with reports that she has been feeling weak/sluggish and like her heart was beating different since Sunday. Pt denies pain/sob.

## 2018-12-16 NOTE — ED Notes (Signed)
Pt sitting on the side of the bed speaking with this RN, NAD, A&Ox4. PT reports while checking her O2 on Sunday her HR would range from 38-72. Reports no chest pain or palpitations. Reports HR readings were normal yesterday but was low as 38 bpm today

## 2018-12-16 NOTE — ED Notes (Signed)
ED TO INPATIENT HANDOFF REPORT  ED Nurse Name and Phone #:  [phone]  S Name/Age/Gender Megan Carroll 73 y.o. female Room/Bed: ED02A/ED02A  Code Status   Code Status: Not on file  Home/SNF/Other Home Patient oriented to: self, place, time and situation Is this baseline? Yes   Triage Complete: Triage complete  Chief Complaint irregular heartbeat  [facility] that she has been feeling weak/sluggish and like her heart was beating different since Sunday. Pt denies pain/sob.    Allergies Allergies  Allergen Reactions  . Sulfa Antibiotics Hives    Level of Care/Admitting Diagnosis ED Disposition    ED Disposition Condition Comment   Admit  Hospital Area: [facility] [[id number]]  Level of Care: Telemetry [5]  Covid Evaluation: Screening Protocol (No Symptoms)  Diagnosis: Mobitz type 2 second degree AV block [098119][[id number]]  Admitting Physician: Bertrum SolSALARY, MONTELL D [[id number]]  Attending Physician: Bertrum SolSALARY, MONTELL D [[id number]]  Estimated length of stay: past midnight tomorrow  Certification:: I certify this patient will need inpatient services for at least 2 midnights  PT Class (Do Not Modify): Inpatient [101]  PT Acc Code (Do Not Modify): Private [1]       B Medical/Surgery History History reviewed. No pertinent past medical history. Past Surgical History:  Procedure Laterality Date  . BREAST EXCISIONAL BIOPSY Right 1989     A IV Location/Drains/Wounds Patient Lines/Drains/Airways Status   Active Line/Drains/Airways    Name:   Placement date:   Placement time:   Site:   Days:   Peripheral IV 12/16/18 Right Antecubital   12/16/18    1450    Antecubital   less than 1          Intake/Output Last 24 hours No intake or output data in the 24 hours ending 12/16/18 1842  Labs/Imaging Results for orders placed or performed during the hospital encounter of 12/16/18 (from the past 48 hour(s))  Basic metabolic panel      Status: Abnormal   Collection Time: 12/16/18  2:32 PM  Result Value Ref Range   Sodium 141 135 - 145 mmol/L   Potassium 4.0 3.5 - 5.1 mmol/L   Chloride 106 98 - 111 mmol/L   CO2 25 22 - 32 mmol/L   Glucose, Bld 112 (H) 70 - 99 mg/dL   BUN 15 8 - 23 mg/dL   Creatinine, Ser 5.780.77 0.44 - 1.00 mg/dL   Calcium 9.5 8.9 - 46.910.3 mg/dL   GFR calc non Af Amer >60 >60 mL/min   GFR calc Af Amer >60 >60 mL/min   Anion gap 10 5 - 15    Comment: Performed at [facility], [address] [place], [place] [address]  CBC     Status: Abnormal   Collection Time: 12/16/18  2:32 PM  Result Value Ref Range   WBC 10.3 4.0 - 10.5 K/uL   RBC 4.78 3.87 - 5.11 MIL/uL   Hemoglobin 15.3 (H) 12.0 - 15.0 g/dL   HCT 84.145.2 32.436.0 - 40.146.0 %   MCV 94.6 80.0 - 100.0 fL   MCH 32.0 26.0 - 34.0 pg   MCHC 33.8 30.0 - 36.0 g/dL   RDW 02.711.8 25.311.5 - 66.415.5 %   Platelets 320 150 - 400 K/uL   nRBC 0.0 0.0 - 0.2 %    Comment: Performed at [facility], [address] [place], [place] [address]  Troponin I - ONCE - STAT     Status: None  Collection Time: 12/16/18  2:32 PM  Result Value Ref Range   Troponin I <0.03 <0.03 ng/mL    Comment: Performed at [facility], [address] [place], [place] [address]  Magnesium     Status: Abnormal   Collection Time: 12/16/18  2:32 PM  Result Value Ref Range   Magnesium 2.5 (H) 1.7 - 2.4 mg/dL    Comment: Performed at [facility], [address] [place], [place] [address]   No results found.  Pending Labs Unresulted Labs (From admission, onward)    Start     Ordered   12/16/18 1546  Novel Coronavirus,NAA,(SEND-OUT TO REF LAB - TAT 24-48 hrs); Hosp Order  (Asymptomatic Patients Labs)  Add-on,   AD    Question:  Rule Out  Answer:  Yes   12/16/18 1545   Signed and Held  CBC  (enoxaparin (LOVENOX)    CrCl >/= 30 ml/min)  Once,   R    Comments:  Baseline for enoxaparin therapy IF NOT ALREADY DRAWN.  Notify MD if PLT < 100 K.    Signed and Held    Signed and Held  Creatinine, serum  (enoxaparin (LOVENOX)    CrCl >/= 30 ml/min)  Once,   R    Comments:  Baseline for enoxaparin therapy IF NOT ALREADY DRAWN.    Signed and Held   Signed and Held  Creatinine, serum  (enoxaparin (LOVENOX)    CrCl >/= 30 ml/min)  Weekly,   R    Comments:  while on enoxaparin therapy    Signed and Held   Signed and Held  TSH  Once,   R     Signed and Held   Signed and Held  Troponin I - Now Then Q6H  Now then every 6 hours,   R     Signed and Held          Vitals/Pain Today's Vitals   12/16/18 1545 12/16/18 1600 12/16/18 1615 12/16/18 1802  BP:    (!) 167/58  Pulse: (!) 37   (!) 59  Resp: 18 20 13 15   Temp:      TempSrc:      SpO2: 96%   97%  Weight:      Height:      PainSc:    0-No pain    Isolation Precautions No active isolations  Medications Medications  sodium chloride flush (NS) 0.9 % injection 3 mL (3 mLs Intravenous Not Given 12/16/18 1445)    Mobility walks Low fall risk   Focused Assessments Cardiac Assessment Handoff:  Cardiac Rhythm: Sinus bradycardia Lab Results  Component Value Date   TROPONINI <0.03 12/16/2018   No results found for: DDIMER Does the Patient currently have chest pain? No     R Recommendations: See Admitting Provider Note  Report given to:   Additional Notes:

## 2018-12-16 NOTE — H&P (Signed)
Megan Carroll NAME: Megan Carroll    MR#:  [id number]  DATE OF BIRTH:  1946-02-19  DATE OF ADMISSION:  12/16/2018  PRIMARY CARE PHYSICIAN: Glendon Axe, MD   REQUESTING/REFERRING PHYSICIAN:   CHIEF COMPLAINT:   Chief Complaint  Patient presents with  . Irregular Heart Beat    HISTORY OF PRESENT ILLNESS: Megan Carroll  is a 73 y.o. female with a known history per below presents emergency room with 2-day history of intermittent fatigue, patient stated on Sunday she noted her heart rate to be in the 38-72 range, was normal and yesterday, today her heart rate was in the 30s and she felt weak after mowing grass, in the emergency room work-up noted for second-degree AV block, case was discussed with Dr. Kohn/cardiology who recommended admission for further care, patient evaluated emergency room, no apparent distress, resting comfortably in bed, heart rate currently normal, patient now be admitted for acute Mobitz type II heart block with symptomatic bradycardia.  PAST MEDICAL HISTORY:   Pre-hypertension Osteoarthritis Osteoporosis  PAST SURGICAL HISTORY:  Past Surgical History:  Procedure Laterality Date  . BREAST EXCISIONAL BIOPSY Right 1989    SOCIAL HISTORY:  Social History   Tobacco Use  . Smoking status: Not on file  Substance Use Topics  . Alcohol use: Not on file    FAMILY HISTORY:  Family History  Problem Relation Age of Onset  . Breast cancer Neg Hx     DRUG ALLERGIES:  Allergies  Allergen Reactions  . Sulfa Antibiotics Hives    REVIEW OF SYSTEMS:   CONSTITUTIONAL: No fever,+ fatigue  EYES: No blurred or double vision.  EARS, NOSE, AND THROAT: No tinnitus or ear pain.  RESPIRATORY: No cough, shortness of breath, wheezing or hemoptysis.  CARDIOVASCULAR: No chest pain, orthopnea, edema.  GASTROINTESTINAL: No nausea, vomiting, diarrhea or abdominal pain.  GENITOURINARY: No dysuria, hematuria.   ENDOCRINE: No polyuria, nocturia,  HEMATOLOGY: No anemia, easy bruising or bleeding SKIN: No rash or lesion. MUSCULOSKELETAL: No joint pain or arthritis.   NEUROLOGIC: No tingling, numbness, weakness.  PSYCHIATRY: No anxiety or depression.   MEDICATIONS AT HOME:  Prior to Admission medications   Not on File      PHYSICAL EXAMINATION:   VITAL SIGNS: Blood pressure (!) 138/59, pulse (!) 37, temperature 98.5 F (36.9 C), temperature source Oral, resp. rate 19, height 4\' 11"  (1.499 m), weight 49.4 kg, SpO2 96 %.  GENERAL:  73 y.o.-year-old patient lying in the bed with no acute distress.  Thin EYES: Pupils equal, round, reactive to light and accommodation. No scleral icterus. Extraocular muscles intact.  HEENT: Head atraumatic, normocephalic. Oropharynx and nasopharynx clear.  NECK:  Supple, no jugular venous distention. No thyroid enlargement, no tenderness.  LUNGS: Normal breath sounds bilaterally, no wheezing, rales,rhonchi or crepitation. No use of accessory muscles of respiration.  CARDIOVASCULAR: S1, S2 normal. No murmurs, rubs, or gallops.  ABDOMEN: Soft, nontender, nondistended. Bowel sounds present. No organomegaly or mass.  EXTREMITIES: No pedal edema, cyanosis, or clubbing.  NEUROLOGIC: Cranial nerves II through XII are intact. Muscle strength 5/5 in all extremities. Sensation intact. Gait not checked.  PSYCHIATRIC: The patient is alert and oriented x 3.  SKIN: No obvious rash, lesion, or ulcer.   LABORATORY PANEL:   CBC Recent Labs  Lab 12/16/18 1432  WBC 10.3  HGB 15.3*  HCT 45.2  PLT 320  MCV 94.6  MCH 32.0  MCHC 33.8  RDW 11.8   ------------------------------------------------------------------------------------------------------------------  Chemistries  Recent Labs  Lab 12/16/18 1432  NA 141  K 4.0  CL 106  CO2 25  GLUCOSE 112*  BUN 15  CREATININE 0.77  CALCIUM 9.5    ------------------------------------------------------------------------------------------------------------------ estimated creatinine clearance is 44 mL/min (by C-G formula based on SCr of 0.77 mg/dL). ------------------------------------------------------------------------------------------------------------------ No results for input(s): TSH, T4TOTAL, T3FREE, THYROIDAB in the last 72 hours.  Invalid input(s): FREET3   Coagulation profile No results for input(s): INR, PROTIME in the last 168 hours. ------------------------------------------------------------------------------------------------------------------- No results for input(s): DDIMER in the last 72 hours. -------------------------------------------------------------------------------------------------------------------  Cardiac Enzymes Recent Labs  Lab 12/16/18 1432  TROPONINI <0.03   ------------------------------------------------------------------------------------------------------------------ Invalid input(s): POCBNP  ---------------------------------------------------------------------------------------------------------------  Urinalysis No results found for: COLORURINE, APPEARANCEUR, LABSPEC, PHURINE, GLUCOSEU, HGBUR, BILIRUBINUR, KETONESUR, PROTEINUR, UROBILINOGEN, NITRITE, LEUKOCYTESUR   RADIOLOGY: No results found.  EKG: Orders placed or performed during the hospital encounter of 12/16/18  . EKG 12-Lead  . EKG 12-Lead  . ED EKG  . ED EKG    IMPRESSION AND PLAN: *Acute Mobitz type II AV block with symptomatic bradycardia Admit to telemetry floor, cycle cardiac enzymes rule out acute coronary syndrome, supplemental oxygen wean as tolerated, cardiology/Dr. consult consulted for expert opinion, n.p.o. after midnight in the event patient requires procedure in the morning, check magnesium level  *Acute generalized weakness Secondary to above Plan of care as stated above  *History of  osteoarthritis Stable Tylenol as needed  *History of pre-hypertension Hydralazine as needed systolic blood pressure greater than 160   All the records are reviewed and case discussed with ED provider. Management plans discussed with the patient, family and they are in agreement.  CODE STATUS:full    TOTAL TIME TAKING CARE OF THIS PATIENT: 40 minutes.    Megan Carroll D  M.D on 12/16/2018   Between 7am to 6pm - Pager - [phone]  After 6pm go to [email or web address] - password [email or web address]  [facility] [place] [facility]  Office  [phone]  CC: Primary care physician; Leotis ShamesSingh, Jasmine, MD   Note: This dictation was prepared with Dragon dictation along with smaller phrase technology. Any transcriptional errors that result from this process are unintentional.

## 2018-12-16 NOTE — Progress Notes (Signed)
Family Meeting Note  Advance Directive:yes  Today a meeting took place with the Patient.  Patient is able to participate   The following clinical team members were present during this meeting:MD  The following were discussed:Patient's diagnosis: 73 y.o. female with a known history per below presents emergency room with 2-day history of intermittent fatigue, patient stated on Sunday she noted her heart rate to be in the 38-72 range, was normal and yesterday, today her heart rate was in the 30s and she felt weak after mowing grass, in the emergency room work-up noted for second-degree AV block, case was discussed with Dr. Kohn/cardiology who recommended admission for further care, patient evaluated emergency room, no apparent distress, resting comfortably in bed, heart rate currently normal, patient now be admitted for acute Mobitz type II heart block with symptomatic bradycardia., Patient's progosis: Unable to determine and Goals for treatment: Full Code  Additional follow-up to be provided: prn  Time spent during discussion:20 minutes  Gorden Harms, MD

## 2018-12-16 NOTE — Consult Note (Signed)
Megan Carroll is a 73 y.o. female  [id number]  Primary Cardiologist: Adrian BlackwaterShaukat Amali Uhls Reason for Consultation: Presyncope with second-degree AV block  HPI: This is a 73 year old pleasant white female who presented to the hospital with lightheadedness for the past couple of days.  She felt fatigued tired and shortness of breath on exertion but this morning felt that her heart rate was 37 and was feeling very weak and felt like she needed to come to the emergency room for evaluation.  She had no frank syncope.   Review of Systems: No orthopnea PND leg swelling or frank syncope no chest pain.   History reviewed. No pertinent past medical history.  (Not in a hospital admission)    . sodium chloride flush  3 mL Intravenous Once    Infusions:   Allergies  Allergen Reactions  . Sulfa Antibiotics Hives    Social History   Socioeconomic History  . Marital status: Married    Spouse name: Not on file  . Number of children: Not on file  . Years of education: Not on file  . Highest education level: Not on file  Occupational History  . Not on file  Social Needs  . Financial resource strain: Not on file  . Food insecurity:    Worry: Not on file    Inability: Not on file  . Transportation needs:    Medical: Not on file    Non-medical: Not on file  Tobacco Use  . Smoking status: Not on file  Substance and Sexual Activity  . Alcohol use: Not on file  . Drug use: Not on file  . Sexual activity: Not on file  Lifestyle  . Physical activity:    Days per week: Not on file    Minutes per session: Not on file  . Stress: Not on file  Relationships  . Social connections:    Talks on phone: Not on file    Gets together: Not on file    Attends religious service: Not on file    Active member of club or organization: Not on file    Attends meetings of clubs or organizations: Not on file    Relationship status: Not on file  . Intimate partner violence:    Fear of current or ex  partner: Not on file    Emotionally abused: Not on file    Physically abused: Not on file    Forced sexual activity: Not on file  Other Topics Concern  . Not on file  Social History Narrative  . Not on file    Family History  Problem Relation Age of Onset  . Breast cancer Neg Hx     PHYSICAL EXAM: Vitals:   12/16/18 1600 12/16/18 1615  BP:    Pulse:    Resp: 20 13  Temp:    SpO2:      No intake or output data in the 24 hours ending 12/16/18 1644  General:  Well appearing. No respiratory difficulty HEENT: normal Neck: supple. no JVD. Carotids 2+ bilat; no bruits. No lymphadenopathy or thryomegaly appreciated. Cor: PMI nondisplaced. Regular rate & rhythm. No rubs, gallops or murmurs. Lungs: clear Abdomen: soft, nontender, nondistended. No hepatosplenomegaly. No bruits or masses. Good bowel sounds. Extremities: no cyanosis, clubbing, rash, edema Neuro: alert & oriented x 3, cranial nerves grossly intact. moves all 4 extremities w/o difficulty. Affect pleasant.  ECG: Sinus rhythm with right bundle branch block and type II second-degree AV block with adequate ventricular  response rate.  Results for orders placed or performed during the hospital encounter of 12/16/18 (from the past 24 hour(s))  Basic metabolic panel     Status: Abnormal   Collection Time: 12/16/18  2:32 PM  Result Value Ref Range   Sodium 141 135 - 145 mmol/L   Potassium 4.0 3.5 - 5.1 mmol/L   Chloride 106 98 - 111 mmol/L   CO2 25 22 - 32 mmol/L   Glucose, Bld 112 (H) 70 - 99 mg/dL   BUN 15 8 - 23 mg/dL   Creatinine, Ser 0.77 0.44 - 1.00 mg/dL   Calcium 9.5 8.9 - 10.3 mg/dL   GFR calc non Af Amer >60 >60 mL/min   GFR calc Af Amer >60 >60 mL/min   Anion gap 10 5 - 15  CBC     Status: Abnormal   Collection Time: 12/16/18  2:32 PM  Result Value Ref Range   WBC 10.3 4.0 - 10.5 K/uL   RBC 4.78 3.87 - 5.11 MIL/uL   Hemoglobin 15.3 (H) 12.0 - 15.0 g/dL   HCT 45.2 36.0 - 46.0 %   MCV 94.6 80.0 - 100.0 fL    MCH 32.0 26.0 - 34.0 pg   MCHC 33.8 30.0 - 36.0 g/dL   RDW 11.8 11.5 - 15.5 %   Platelets 320 150 - 400 K/uL   nRBC 0.0 0.0 - 0.2 %  Troponin I - ONCE - STAT     Status: None   Collection Time: 12/16/18  2:32 PM  Result Value Ref Range   Troponin I <0.03 <0.03 ng/mL   No results found.   ASSESSMENT AND PLAN: Type II second-degree AV block with presyncope and as per patient intermittent bradycardia lesion leading to significant symptoms.  Appears to be hemodynamically stable with heart rate above 60 at this time.  Advise consulting [facility] cardiology Dr. Raliegh Scarlet for permanent pacemaker implantation.  [name] A

## 2018-12-17 LAB — ECHOCARDIOGRAM COMPLETE
Height: 59 in
Weight: 1744 oz

## 2018-12-17 LAB — TROPONIN I
Troponin I: <0.03 ng/mL (ref <0.03)
Troponin I: <0.03 ng/mL (ref <0.03)

## 2018-12-17 LAB — NOVEL CORONAVIRUS, NAA (HOSP ORDER, SEND-OUT TO REF LAB; TAT 18-24 HRS): SARS-CoV-2, NAA: NOT DETECTED

## 2018-12-17 MED ORDER — CEFAZOLIN SODIUM-DEXTROSE 2-4 GM/100ML-% IV SOLN
2.0000 g | INTRAVENOUS | Status: AC
  Administered 2018-12-18: 2 g via INTRAVENOUS
  Filled 2018-12-17: qty 100

## 2018-12-17 MED ORDER — ATROPINE SULFATE 0.4 MG/ML IJ SOLN
0.5000 mg | Freq: Once | INTRAMUSCULAR | Status: DC | PRN
  Filled 2018-12-17: qty 1.25

## 2018-12-17 NOTE — Progress Notes (Signed)
SUBJECTIVE: No shortness of breath or chest pain. Does have mild weakness.   Vitals:   12/16/18 2024 12/16/18 2140 12/17/18 0309 12/17/18 0807  BP: (!) 150/56  (!) 127/57 (!) 147/52  Pulse: (!) 56  (!) 47 (!) 44  Resp:   16 17  Temp: 98.1 F (36.7 C)  98.9 F (37.2 C) 98.6 F (37 C)  TempSrc: Oral  Oral Oral  SpO2: 97% 97% 96% 97%  Weight:  47.6 kg 48.2 kg   Height:  4\' 11"  (1.499 m)      Intake/Output Summary (Last 24 hours) at 12/17/2018 0942 Last data filed at 12/17/2018 0500 Gross per 24 hour  Intake -  Output 250 ml  Net -250 ml    LABS: Basic Metabolic Panel: Recent Labs    12/16/18 1432  NA 141  K 4.0  CL 106  CO2 25  GLUCOSE 112*  BUN 15  CREATININE 0.77  CALCIUM 9.5  MG 2.5*   Liver Function Tests: No results for input(s): AST, ALT, ALKPHOS, BILITOT, PROT, ALBUMIN in the last 72 hours. No results for input(s): LIPASE, AMYLASE in the last 72 hours. CBC: Recent Labs    12/16/18 1432  WBC 10.3  HGB 15.3*  HCT 45.2  MCV 94.6  PLT 320   Cardiac Enzymes: Recent Labs    12/16/18 2117 12/17/18 0305 12/17/18 0908  TROPONINI <0.03 <0.03 <0.03   BNP: Invalid input(s): POCBNP D-Dimer: No results for input(s): DDIMER in the last 72 hours. Hemoglobin A1C: No results for input(s): HGBA1C in the last 72 hours. Fasting Lipid Panel: No results for input(s): CHOL, HDL, LDLCALC, TRIG, CHOLHDL, LDLDIRECT in the last 72 hours. Thyroid Function Tests: Recent Labs    12/16/18 2117  TSH 2.946   Anemia Panel: No results for input(s): VITAMINB12, FOLATE, FERRITIN, TIBC, IRON, RETICCTPCT in the last 72 hours.   PHYSICAL EXAM General: Well developed, well nourished, in no acute distress HEENT:  Normocephalic and atramatic Neck:  No JVD.  Lungs: Clear bilaterally to auscultation and percussion. Heart: HRRR . Normal S1 and S2 without gallops or murmurs.  Abdomen: Bowel sounds are positive, abdomen soft and non-tender  Msk:  Back normal, normal gait.  Normal strength and tone for age. Extremities: No clubbing, cyanosis or edema.   Neuro: Alert and oriented X 3. Psych:  Good affect, responds appropriately  TELEMETRY: Sinus bradycardia 48bpm  ASSESSMENT AND PLAN:  Type II second degree AV block with presyncope: Heart rate is in the 40s currently but hemodynamically stable.  Dr. Saralyn Pilar plans to place permanent pacemaker tomorrow.   Active Problems:   Mobitz type 2 second degree AV block    Jake Bathe, NP-C 12/17/2018 9:42 AM Cell: [phone]

## 2018-12-17 NOTE — Progress Notes (Signed)
[facility] [place] [facility]   PATIENT NAME: Megan HarrisBrenda Coreas    MR#:  [id number]  DATE OF BIRTH:  July 27, 1945  SUBJECTIVE:  CHIEF COMPLAINT:   Chief Complaint  Patient presents with  . Irregular Heart Beat   Patient continues to be bradycardic in the 40s.  REVIEW OF SYSTEMS:    Review of Systems  Constitutional: Negative for chills and fever.  HENT: Negative for sore throat.   Eyes: Negative for blurred vision, double vision and pain.  Respiratory: Negative for cough, hemoptysis, shortness of breath and wheezing.   Cardiovascular: Negative for chest pain, palpitations, orthopnea and leg swelling.  Gastrointestinal: Negative for abdominal pain, constipation, diarrhea, heartburn, nausea and vomiting.  Genitourinary: Negative for dysuria and hematuria.  Musculoskeletal: Negative for back pain and joint pain.  Skin: Negative for rash.  Neurological: Negative for sensory change, speech change, focal weakness and headaches.  Endo/Heme/Allergies: Does not bruise/bleed easily.  Psychiatric/Behavioral: Negative for depression. The patient is not nervous/anxious.     DRUG ALLERGIES:   Allergies  Allergen Reactions  . Sulfa Antibiotics Hives    VITALS:  Blood pressure (!) 147/52, pulse (!) 44, temperature 98.6 F (37 C), temperature source Oral, resp. rate 17, height 4\' 11"  (1.499 m), weight 48.2 kg, SpO2 97 %.  PHYSICAL EXAMINATION:   Physical Exam  GENERAL:  [age] y.o.-year-old patient lying in the bed with no acute distress.  EYES: Pupils equal, round, reactive to light and accommodation. No scleral icterus. Extraocular muscles intact.  HEENT: Head atraumatic, normocephalic. Oropharynx and nasopharynx clear.  NECK:  Supple, no jugular venous distention. No thyroid enlargement, no tenderness.  LUNGS: Normal breath sounds bilaterally, no wheezing, rales, rhonchi. No use of accessory muscles of respiration.  CARDIOVASCULAR: S1, S2 . No murmurs, rubs, or  gallops.  Bradycardia ABDOMEN: Soft, nontender, nondistended. Bowel sounds present. No organomegaly or mass.  EXTREMITIES: No cyanosis, clubbing or edema b/l.    NEUROLOGIC: Cranial nerves II through XII are intact. No focal Motor or sensory deficits b/l.   PSYCHIATRIC: The patient is alert and oriented x 3.  SKIN: No obvious rash, lesion, or ulcer.   LABORATORY PANEL:   CBC Recent Labs  Lab 12/16/18 1432  WBC 10.3  HGB 15.3*  HCT 45.2  PLT 320   ------------------------------------------------------------------------------------------------------------------ Chemistries  Recent Labs  Lab 12/16/18 1432  NA 141  K 4.0  CL 106  CO2 25  GLUCOSE 112*  BUN 15  CREATININE 0.77  CALCIUM 9.5  MG 2.5*   ------------------------------------------------------------------------------------------------------------------  Cardiac Enzymes Recent Labs  Lab 12/17/18 0908  TROPONINI <0.03   ------------------------------------------------------------------------------------------------------------------  RADIOLOGY:  No results found.   ASSESSMENT AND PLAN:   *Acute Mobitz type II AV block with symptomatic bradycardia Continue telemetry monitoring.  Scheduled for pacemaker tomorrow.  [facility] with [facility].  *Acute generalized weakness Secondary to above  *History of osteoarthritis Stable Tylenol as needed  *History of pre-hypertension Hydralazine as needed systolic blood pressure greater than 160  All the records are reviewed and case discussed with Care Management/Social Worker. Management plans discussed with the patient, family and they are in agreement.  CODE STATUS: Full code  DVT Prophylaxis: SCDs  TOTAL TIME TAKING CARE OF THIS PATIENT: 35 minutes.   POSSIBLE D/C IN 1-2 DAYS, DEPENDING ON CLINICAL CONDITION.  Molinda BailiffSrikar R Shadonna Benedick M.D on 12/17/2018 at 11:02 AM  Between 7am to 6pm - Pager - [phone]  After 6pm go to [email or web address] - password  [email or web address]  [facility] [place]  [facility]  Office  [phone]  CC: Primary care physician; Glendon Axe, MD  Note: This dictation was prepared with Dragon dictation along with smaller phrase technology. Any transcriptional errors that result from this process are unintentional.

## 2018-12-17 NOTE — Anesthesia Preprocedure Evaluation (Addendum)
Anesthesia Evaluation  Patient identified by MRN, date of birth, ID band Patient awake    Reviewed: Allergy & Precautions, NPO status , Patient's Chart, lab work & pertinent test results, reviewed documented beta blocker date and time   Airway Mallampati: II  TM Distance: >3 FB     Dental  (+) Chipped   Pulmonary           Cardiovascular + dysrhythmias      Neuro/Psych    GI/Hepatic   Endo/Other    Renal/GU      Musculoskeletal   Abdominal   Peds  Hematology   Anesthesia Other Findings Past Medical History: No date: Medical history non-contributory No date: No pertinent past medical history Mobitz II EF 60-65 HR 37-40. Otherwise healthy lady.   Reproductive/Obstetrics                            Anesthesia Physical Anesthesia Plan  ASA: III  Anesthesia Plan: General and MAC   Post-op Pain Management:    Induction: Intravenous  PONV Risk Score and Plan:   Airway Management Planned:   Additional Equipment:   Intra-op Plan:   Post-operative Plan:   Informed Consent: I have reviewed the patients History and Physical, chart, labs and discussed the procedure including the risks, benefits and alternatives for the proposed anesthesia with the patient or authorized representative who has indicated his/her understanding and acceptance.       Plan Discussed with: CRNA  Anesthesia Plan Comments:         Anesthesia Quick Evaluation

## 2018-12-17 NOTE — Progress Notes (Signed)
[facility] Cardiology  SUBJECTIVE: Patient laying in bed, denies chest pain, shortness of breath, presyncope or syncope   Vitals:   12/16/18 2024 12/16/18 2140 12/17/18 0309 12/17/18 0807  BP: (!) 150/56  (!) 127/57 (!) 147/52  Pulse: (!) 56  (!) 47 (!) 44  Resp:   16 17  Temp: 98.1 F (36.7 C)  98.9 F (37.2 C) 98.6 F (37 C)  TempSrc: Oral  Oral Oral  SpO2: 97% 97% 96% 97%  Weight:  47.6 kg 48.2 kg   Height:  4\' 11"  (1.499 m)       Intake/Output Summary (Last 24 hours) at 12/17/2018 1244 Last data filed at 12/17/2018 1000 Gross per 24 hour  Intake 3 ml  Output 450 ml  Net -447 ml      PHYSICAL EXAM  General: Well developed, well nourished, in no acute distress HEENT:  Normocephalic and atramatic Neck:  No JVD.  Lungs: Clear bilaterally to auscultation and percussion. Heart: HRRR . Normal S1 and S2 without gallops or murmurs.  Abdomen: Bowel sounds are positive, abdomen soft and non-tender  Msk:  Back normal, normal gait. Normal strength and tone for age. Extremities: No clubbing, cyanosis or edema.   Neuro: Alert and oriented X 3. Psych:  Good affect, responds appropriately   LABS: Basic Metabolic Panel: Recent Labs    12/16/18 1432  NA 141  K 4.0  CL 106  CO2 25  GLUCOSE 112*  BUN 15  CREATININE 0.77  CALCIUM 9.5  MG 2.5*   Liver Function Tests: No results for input(s): AST, ALT, ALKPHOS, BILITOT, PROT, ALBUMIN in the last 72 hours. No results for input(s): LIPASE, AMYLASE in the last 72 hours. CBC: Recent Labs    12/16/18 1432  WBC 10.3  HGB 15.3*  HCT 45.2  MCV 94.6  PLT 320   Cardiac Enzymes: Recent Labs    12/16/18 2117 12/17/18 0305 12/17/18 0908  TROPONINI <0.03 <0.03 <0.03   BNP: Invalid input(s): POCBNP D-Dimer: No results for input(s): DDIMER in the last 72 hours. Hemoglobin A1C: No results for input(s): HGBA1C in the last 72 hours. Fasting Lipid Panel: No results for input(s): CHOL, HDL, LDLCALC, TRIG, CHOLHDL, LDLDIRECT in  the last 72 hours. Thyroid Function Tests: Recent Labs    12/16/18 2117  TSH 2.946   Anemia Panel: No results for input(s): VITAMINB12, FOLATE, FERRITIN, TIBC, IRON, RETICCTPCT in the last 72 hours.  No results found.   Echo LVEF 60 to 65%  TELEMETRY: 2:1 AV block at a rate of 40 bpm:  ASSESSMENT AND PLAN:  Active Problems:   Mobitz type 2 second degree AV block    1.  Mobitz 2 second-degree AV block at a rate of 40 bpm  Recommendations  1.  Dual-chamber pacemaker scheduled for 12/18/2018.  The risk, benefits and alternatives of dual-chamber pacemaker implantation were explained to the patient and informed consent was obtained.   Isaias Cowman, MD, PhD, [facility] 12/17/2018 12:44 PM

## 2018-12-18 ENCOUNTER — Inpatient Hospital Stay: Payer: Medicare HMO | Admitting: Anesthesiology

## 2018-12-18 ENCOUNTER — Encounter
Admission: EM | Disposition: A | Discharge status: Home / Self Care | Payer: Self-pay | Source: Home / Self Care | Attending: Internal Medicine

## 2018-12-18 ENCOUNTER — Encounter: Payer: Self-pay | Admitting: Anesthesiology

## 2018-12-18 ENCOUNTER — Inpatient Hospital Stay: Payer: Medicare HMO

## 2018-12-18 HISTORY — PX: PACEMAKER INSERTION: SHX728

## 2018-12-18 LAB — SURGICAL PCR SCREEN
MRSA, PCR: NEGATIVE
Staphylococcus aureus: NEGATIVE

## 2018-12-18 SURGERY — INSERTION, CARDIAC PACEMAKER
Anesthesia: Monitor Anesthesia Care

## 2018-12-18 SURGERY — INSERTION, CARDIAC PACEMAKER
Anesthesia: Moderate Sedation | Laterality: Left

## 2018-12-18 MED ORDER — EPHEDRINE SULFATE 50 MG/ML IJ SOLN
INTRAMUSCULAR | Status: DC | PRN
  Administered 2018-12-18: 5 mg via INTRAVENOUS

## 2018-12-18 MED ORDER — LIDOCAINE 1 % OPTIME INJ - NO CHARGE
INTRAMUSCULAR | Status: DC | PRN
  Administered 2018-12-18: 30 mL

## 2018-12-18 MED ORDER — ONDANSETRON HCL 4 MG/2ML IJ SOLN
INTRAMUSCULAR | Status: AC
  Filled 2018-12-18: qty 2

## 2018-12-18 MED ORDER — MIDAZOLAM HCL 2 MG/2ML IJ SOLN
INTRAMUSCULAR | Status: AC
  Filled 2018-12-18: qty 2

## 2018-12-18 MED ORDER — FENTANYL CITRATE (PF) 100 MCG/2ML IJ SOLN: 25.0000 ug | INTRAMUSCULAR | Status: DC | PRN

## 2018-12-18 MED ORDER — ONDANSETRON HCL 4 MG/2ML IJ SOLN
4.0000 mg | Freq: Once | INTRAMUSCULAR | Status: AC | PRN
  Administered 2018-12-18: 4 mg via INTRAVENOUS

## 2018-12-18 MED ORDER — PROPOFOL 500 MG/50ML IV EMUL
INTRAVENOUS | Status: AC
  Filled 2018-12-18: qty 100

## 2018-12-18 MED ORDER — CEFAZOLIN SODIUM-DEXTROSE 1-4 GM/50ML-% IV SOLN
1.0000 g | Freq: Four times a day (QID) | INTRAVENOUS | Status: AC
  Administered 2018-12-18 – 2018-12-19 (×3): 1 g via INTRAVENOUS
  Filled 2018-12-18 (×3): qty 50

## 2018-12-18 MED ORDER — PROPOFOL 500 MG/50ML IV EMUL
INTRAVENOUS | Status: DC | PRN
  Administered 2018-12-18: 50 ug/kg/min via INTRAVENOUS

## 2018-12-18 MED ORDER — GENTAMICIN SULFATE 40 MG/ML IJ SOLN
INTRAMUSCULAR | Status: AC
  Filled 2018-12-18: qty 2

## 2018-12-18 MED ORDER — SODIUM CHLORIDE 0.9 % IV SOLN
INTRAVENOUS | Status: DC | PRN
  Administered 2018-12-18: 14:00:00 120 mL

## 2018-12-18 MED ORDER — ONDANSETRON HCL 4 MG/2ML IJ SOLN: 4.0000 mg | Freq: Four times a day (QID) | INTRAMUSCULAR | Status: DC | PRN

## 2018-12-18 MED ORDER — CEFAZOLIN SODIUM-DEXTROSE 2-4 GM/100ML-% IV SOLN
INTRAVENOUS | Status: AC
  Filled 2018-12-18: qty 100

## 2018-12-18 MED ORDER — ACETAMINOPHEN 325 MG PO TABS: 325.0000 mg | ORAL_TABLET | ORAL | Status: DC | PRN

## 2018-12-18 MED ORDER — EPHEDRINE SULFATE 50 MG/ML IJ SOLN
INTRAMUSCULAR | Status: AC
  Filled 2018-12-18: qty 1

## 2018-12-18 MED ORDER — MIDAZOLAM HCL 2 MG/2ML IJ SOLN
INTRAMUSCULAR | Status: DC | PRN
  Administered 2018-12-18: 1 mg via INTRAVENOUS

## 2018-12-18 MED ORDER — PROPOFOL 10 MG/ML IV BOLUS
INTRAVENOUS | Status: AC
  Filled 2018-12-18: qty 40

## 2018-12-18 MED ORDER — FENTANYL CITRATE (PF) 100 MCG/2ML IJ SOLN
INTRAMUSCULAR | Status: AC
  Filled 2018-12-18: qty 2

## 2018-12-18 MED ORDER — FENTANYL CITRATE (PF) 100 MCG/2ML IJ SOLN
INTRAMUSCULAR | Status: DC | PRN
  Administered 2018-12-18: 25 ug via INTRAVENOUS

## 2018-12-18 SURGICAL SUPPLY — 41 items
APL PRP STRL LF DISP 70% ISPRP (MISCELLANEOUS) ×1
BAG DECANTER FOR FLEXI CONT (MISCELLANEOUS) ×3 IMPLANT
BRUSH SCRUB EZ  4% CHG (MISCELLANEOUS) ×1
BRUSH SCRUB EZ 4% CHG (MISCELLANEOUS) ×1 IMPLANT
CABLE SURG 12 DISP A/V CHANNEL (MISCELLANEOUS) ×4 IMPLANT
CANISTER SUCT 1200ML W/VALVE (MISCELLANEOUS) ×2 IMPLANT
CHLORAPREP W/TINT 26 (MISCELLANEOUS) ×2 IMPLANT
COVER LIGHT HANDLE STERIS (MISCELLANEOUS) ×4 IMPLANT
COVER MAYO STAND STRL (DRAPES) ×2 IMPLANT
COVER WAND RF STERILE (DRAPES) ×2 IMPLANT
DRAPE C-ARM XRAY 36X54 (DRAPES) ×2 IMPLANT
DRSG TEGADERM 4X4.75 (GAUZE/BANDAGES/DRESSINGS) ×2 IMPLANT
DRSG TELFA 4X3 1S NADH ST (GAUZE/BANDAGES/DRESSINGS) ×2 IMPLANT
ELECT REM PT RETURN 9FT ADLT (ELECTROSURGICAL) ×2
ELECTRODE REM PT RTRN 9FT ADLT (ELECTROSURGICAL) ×1 IMPLANT
GLIDEWIRE STIFF .35X180X3 HYDR (WIRE) IMPLANT
GLOVE BIO SURGEON STRL SZ7.5 (GLOVE) ×2 IMPLANT
GLOVE BIO SURGEON STRL SZ8 (GLOVE) ×2 IMPLANT
GOWN STRL REUS W/ TWL LRG LVL3 (GOWN DISPOSABLE) ×1 IMPLANT
GOWN STRL REUS W/ TWL XL LVL3 (GOWN DISPOSABLE) ×1 IMPLANT
GOWN STRL REUS W/TWL LRG LVL3 (GOWN DISPOSABLE) ×2
GOWN STRL REUS W/TWL XL LVL3 (GOWN DISPOSABLE) ×2
IMMOBILIZER SHDR MD LX WHT (SOFTGOODS) IMPLANT
IMMOBILIZER SHDR XL LX WHT (SOFTGOODS) IMPLANT
INTRO PACEMAKR LEAD 9FR 13CM (INTRODUCER) ×4
INTRO PACEMKR SHEATH II 7FR (MISCELLANEOUS) ×4
INTRODUCER PACEMKR LD 9FR 13CM (INTRODUCER) ×1 IMPLANT
INTRODUCER PACEMKR SHTH II 7FR (MISCELLANEOUS) ×1 IMPLANT
IPG PACE AZUR XT DR MRI W1DR01 (Pacemaker) IMPLANT
IV NS 500ML (IV SOLUTION) ×2
IV NS 500ML BAXH (IV SOLUTION) ×1 IMPLANT
KIT TURNOVER KIT A (KITS) ×2 IMPLANT
LABEL OR SOLS (LABEL) ×2 IMPLANT
LEAD CAPSURE NOVUS 45CM (Lead) ×1 IMPLANT
LEAD CAPSURE NOVUS 5076-52CM (Lead) ×1 IMPLANT
LEAD PASSIVE MRI 4574-45 (Lead) ×1 IMPLANT
MARKER SKIN DUAL TIP RULER LAB (MISCELLANEOUS) ×2 IMPLANT
PACE AZURE XT DR MRI W1DR01 (Pacemaker) ×2 IMPLANT
PACK PACE INSERTION (MISCELLANEOUS) ×2 IMPLANT
PAD ONESTEP ZOLL R SERIES ADT (MISCELLANEOUS) ×2 IMPLANT
SUT SILK 0 SH 30 (SUTURE) ×6 IMPLANT

## 2018-12-18 NOTE — Plan of Care (Signed)
Heart rate remained mostly in the 30's-40's over night, pt was asleep and asymptomatic. Pt for pacemaker placement today.  Problem: Clinical Measurements: Goal: Respiratory complications will improve Outcome: Progressing Note: On room air   Problem: Activity: Goal: Risk for activity intolerance will decrease Outcome: Progressing Note: Independent in room, tolerating well   Problem: Coping: Goal: Level of anxiety will decrease Outcome: Progressing   Problem: Elimination: Goal: Will not experience complications related to urinary retention Outcome: Progressing   Problem: Pain Managment: Goal: General experience of comfort will improve Outcome: Progressing Note: No complaints of pain this shift   Problem: Safety: Goal: Ability to remain free from injury will improve Outcome: Progressing   Problem: Skin Integrity: Goal: Risk for impaired skin integrity will decrease Outcome: Progressing   Problem: Education: Goal: Knowledge of General Education information will improve Description: Including pain rating scale, medication(s)/side effects and non-pharmacologic comfort measures Outcome: Completed/Met   Problem: Nutrition: Goal: Adequate nutrition will be maintained Outcome: Completed/Met

## 2018-12-18 NOTE — Discharge Instructions (Signed)
Biventricular Pacemaker Implantation, Care After  Refer to this sheet in the next few weeks. These instructions provide you with information about caring for yourself after your procedure. Your health care provider may also give you more specific instructions. Your treatment has been planned according to current medical practices, but problems sometimes occur. Call your health care provider if you have any problems or questions after your procedure.  What can I expect after the procedure?  After the procedure, it is common to have:  · Mild pain or soreness in your chest for several days.  · A small amount of blood or clear fluid coming from your incision.  · A slight bump in your chest where the pulse generator was placed. You may be able to feel the generator under your skin. This is normal.  Follow these instructions at home:  Medicines  · Take over-the-counter and prescription medicines only as told by your health care provider.  · Do not take any new medicines without asking your health care provider first.  · If you were prescribed an antibiotic medicine, take it as told by your health care provider. Do not stop taking the antibiotic even if you start to feel better.  Incision care         · Keep your incision area clean and dry.  · Follow instructions from your health care provider about how to take care of your incision. Make sure you:  ? Wash your hands with soap and water before you change your bandage (dressing). If soap and water are not available, use hand sanitizer.  ? Change your dressing as told by your health care provider.  ? Leave stitches (sutures), skin glue, or adhesive strips in place. These skin closures may need to stay in place for 2 weeks or longer. If adhesive strip edges start to loosen and curl up, you may trim the loose edges. Do not remove adhesive strips completely unless your health care provider tells you to do that.  · Check your incision area every day for signs of infection.  Check for:  ? More redness, swelling, or pain.  ? More fluid or blood.  ? Warmth.  ? Pus or a bad smell.  Activity  · Return to your normal activities as told by your health care provider. Ask your health care provider what activities are safe for you.  · Do not lift anything that is heavier than 10 lb (4.5 kg) until your health care provider approves.  · Do not lift your upper arms above your shoulders for at least 6 weeks or as long as told by your health care provider.  ? If you sleep with your arms above your head, wear an arm restraint while you sleep to prevent this from happening.  ? Avoid sudden movements that pull your upper arms far away from your body for at least 6 weeks.  · Do a mild form of exercise at least once a day. As you feel better, you may exercise more.  · Gently stretch your shoulders at least once a day to help prevent stiffness in your chest.  Electricity and Magnetic Fields  · Avoid places and objects that have a strong electric or magnetic field. This includes:  ? Airport security checkpoints. When you are at the airport, tell officials that you have a pacemaker and show them your pacemaker identification card. Officials will check you in safely so that your pacemaker is not damaged. Do not allow magnetic wands to   be waved near your pacemaker. That can make the pacemaker stop working.  ? Metal detectors. If you must pass through a metal detector, walk through it quickly. Do not stop under the detector or stand near it.  ? Power plants.  ? Large electrical generators.  ? Radiofrequency transmission towers, such as cell phone and radio towers.  · Do not use amateur ("ham") radio equipment or electric ("arc") welding torches. If you are not sure whether something is safe to use, ask your health care provider.  ? Some devices may be safe to use if you hold them at least 1 ft (0.3 m) from your pacemaker. These devices may include power tools, lawn mowers, and speakers.  · When you talk on your  cell phone, hold it to your ear that is opposite from the side that your pacemaker is on. Do not leave your cell phone in a pocket over your pacemaker.  Long-Term Care  · Carry your pacemaker identification card with you at all times, especially when you travel.  · Consider wearing a medical alert bracelet or necklace that explains your pacemaker and any heart conditions you have.  · Tell all health care providers who care for you that you have a pacemaker. This may prevent you from having an MRI because of the strong magnets used during that test.  · Have your pacemaker checked every 3-6 months or as often as told by your health care provider.  General instructions  · Do not use any tobacco products, such as cigarettes, chewing tobacco, or e-cigarettes. If you need help quitting, ask your health care provider.  · Do not drive or operate heavy machinery while taking prescription pain medicine.  · Do not take baths, swim, or use a hot tub until your health care provider approves.  · Follow instructions from your health care provider about eating or drinking restrictions.  · Weigh yourself every day and write down your weight.  · Keep all follow-up visits as told by your health care provider. This is important.  Contact a health care provider if:  · You suddenly gain 3 lb (1.4 kg) or more in 24 hours.  · You have swelling in your feet or legs.  · You have an irregular heartbeat (palpitations).  · You have more redness, swelling, or pain around your incision.  · You have more fluid or blood coming from your incision.  · Your incision area feels warm to the touch.  · You have pus or a bad smell coming from your incision.  Get help right away if:  · You have chest pain.  · You have difficulty breathing.  · You suddenly feel light-headed.  · You have a fever.  · You faint.  These symptoms may represent a serious problem that is an emergency. Do not wait to see if the symptoms will go away. Get medical help right away.  Call your local emergency services (911 in the [place] Do not drive yourself to the hospital.  This information is not intended to replace advice given to you by your health care provider. Make sure you discuss any questions you have with your health care provider.  Document Released: 03/19/2012 Document Revised: 02/10/2018 Document Reviewed: 03/20/2015  Elsevier Interactive Patient Education © 2019 [employer]

## 2018-12-18 NOTE — Anesthesia Postprocedure Evaluation (Signed)
Anesthesia Post Note  Patient: Megan Carroll  Procedure(s) Performed: INSERTION DUAL PACEMAKER (N/A )  Patient location during evaluation: PACU Anesthesia Type: MAC Level of consciousness: awake and alert Pain management: pain level controlled Vital Signs Assessment: post-procedure vital signs reviewed and stable Respiratory status: spontaneous breathing, nonlabored ventilation and respiratory function stable Cardiovascular status: blood pressure returned to baseline and stable Postop Assessment: no apparent nausea or vomiting Anesthetic complications: no     Last Vitals:  Vitals:   12/18/18 1434 12/18/18 1449  BP: (!) 143/74 (!) 143/69  Pulse: 83 83  Resp: 18 13  Temp:  36.8 C  SpO2: 98% 97%    Last Pain:  Vitals:   12/18/18 1449  TempSrc:   PainSc: 0-No pain                 Durenda Hurt

## 2018-12-18 NOTE — Anesthesia Post-op Follow-up Note (Signed)
Anesthesia QCDR form completed.        

## 2018-12-18 NOTE — Progress Notes (Signed)
SUBJECTIVE: Patient still has some dizziness but much improved   Vitals:   12/17/18 1914 12/18/18 0428 12/18/18 0500 12/18/18 0734  BP: (!) 114/54 (!) 133/54  (!) 138/48  Pulse: (!) 47 (!) 40  (!) 40  Resp: 16 16  16   Temp: 98.2 F (36.8 C) 97.7 F (36.5 C)  98 F (36.7 C)  TempSrc: Oral Oral  Oral  SpO2: 97% 98%  97%  Weight:   48.7 kg   Height:        Intake/Output Summary (Last 24 hours) at 12/18/2018 1025 Last data filed at 12/18/2018 1020 Gross per 24 hour  Intake 248 ml  Output 0 ml  Net 248 ml    LABS: Basic Metabolic Panel: Recent Labs    12/16/18 1432  NA 141  K 4.0  CL 106  CO2 25  GLUCOSE 112*  BUN 15  CREATININE 0.77  CALCIUM 9.5  MG 2.5*   Liver Function Tests: No results for input(s): AST, ALT, ALKPHOS, BILITOT, PROT, ALBUMIN in the last 72 hours. No results for input(s): LIPASE, AMYLASE in the last 72 hours. CBC: Recent Labs    12/16/18 1432  WBC 10.3  HGB 15.3*  HCT 45.2  MCV 94.6  PLT 320   Cardiac Enzymes: Recent Labs    12/16/18 2117 12/17/18 0305 12/17/18 0908  TROPONINI <0.03 <0.03 <0.03   BNP: Invalid input(s): POCBNP D-Dimer: No results for input(s): DDIMER in the last 72 hours. Hemoglobin A1C: No results for input(s): HGBA1C in the last 72 hours. Fasting Lipid Panel: No results for input(s): CHOL, HDL, LDLCALC, TRIG, CHOLHDL, LDLDIRECT in the last 72 hours. Thyroid Function Tests: Recent Labs    12/16/18 2117  TSH 2.946   Anemia Panel: No results for input(s): VITAMINB12, FOLATE, FERRITIN, TIBC, IRON, RETICCTPCT in the last 72 hours.   PHYSICAL EXAM General: Well developed, well nourished, in no acute distress HEENT:  Normocephalic and atramatic Neck:  No JVD.  Lungs: Clear bilaterally to auscultation and percussion. Heart: HRRR . Normal S1 and S2 without gallops or murmurs.  Abdomen: Bowel sounds are positive, abdomen soft and non-tender  Msk:  Back normal, normal gait. Normal strength and tone for  age. Extremities: No clubbing, cyanosis or edema.   Neuro: Alert and oriented X 3. Psych:  Good affect, responds appropriately  TELEMETRY: Type II second-degree AV block  ASSESSMENT AND PLAN: Presyncope with type II second-degree AV block going for permanent pacemaker implantation today.  Patient may be discharged tomorrow.  Patient will be followed up with me on Tuesday at 10:00.  Active Problems:   Mobitz type 2 second degree AV block    Parissa Chiao A, MD, [facility] 12/18/2018 10:25 AM

## 2018-12-18 NOTE — Transfer of Care (Signed)
Immediate Anesthesia Transfer of Care Note  Patient: Megan Carroll  Procedure(s) Performed: INSERTION DUAL PACEMAKER (N/A )  Patient Location: PACU  Anesthesia Type:MAC and General  Level of Consciousness: awake and patient cooperative  Airway & Oxygen Therapy: Patient Spontanous Breathing and Patient connected to face mask oxygen  Post-op Assessment: Report given to RN and Post -op Vital signs reviewed and stable  Post vital signs: stable  Last Vitals:  Vitals Value Taken Time  BP 151/76 12/18/18 1404  Temp 36.4 C 12/18/18 1404  Pulse 86 12/18/18 1410  Resp 19 12/18/18 1409  SpO2 100 % 12/18/18 1410  Vitals shown include unvalidated device data.  Last Pain:  Vitals:   12/18/18 1404  TempSrc:   PainSc: 0-No pain         Complications: No apparent anesthesia complications

## 2018-12-18 NOTE — Op Note (Signed)
[facility]   12/18/2018                     1:59 PM  PATIENT:  Megan Carroll    PRE-OPERATIVE DIAGNOSIS:   second degree heartblock  POST-OPERATIVE DIAGNOSIS:  Same  PROCEDURE:  INSERTION DUAL PACEMAKER  SURGEON:  Isaias Cowman, MD    ANESTHESIA:     PREOPERATIVE INDICATIONS:  Megan Carroll is a  73 y.o. female with a diagnosis of  second degree heartblock who failed conservative measures and elected for surgical management.    The risks benefits and alternatives were discussed with the patient preoperatively including but not limited to the risks of infection, bleeding, cardiopulmonary complications, the need for revision surgery, among others, and the patient was willing to proceed.   OPERATIVE PROCEDURE: The patient was brought to the operating room in a fasting state.  The left pectoral region was prepped and draped in usual sterile manner.  Anesthesia was obtained 1% lidocaine locally.  A 6 cm incision was performed the left pectoral region.  Access is obtained the left subclavian vein by fine-needle aspiration.  MRI compatible leads were positioned into the right ventricular apical septum ( Medtronic [id number] ) and right atrial appendage ( Medtronic [id number] ) under fluoroscopic guidance.  After proper thresholds were obtained the leads were sutured in place.  The leads were connected MRI compatible dual-chamber rate responsive pacemaker generator ( Medtronic [id number] ).  The pacemaker pocket was irrigated with gentamicin solution.  The pacemaker generator was positioned into the pocket and the pocket was closed with 2-0 and 4-0 Vicryl, respectively.  Steri-Strips and a pressure dressing were applied.  Postprocedural interrogation revealed appropriate dual-chamber atrial and ventricular sensing and pacing thresholds.  There were no periprocedural complications.

## 2018-12-18 NOTE — Progress Notes (Signed)
[facility] - [place] at [facility]   PATIENT NAME: Megan HarrisBrenda Carroll    MR#:  [id number]  DATE OF BIRTH:  [birth year]  SUBJECTIVE:  CHIEF COMPLAINT:   Chief Complaint  Patient presents with  . Irregular Heart Beat   Patient continues to be bradycardic . Waiting for Pacemaker placement  REVIEW OF SYSTEMS:    Review of Systems  Constitutional: Negative for chills and fever.  HENT: Negative for sore throat.   Eyes: Negative for blurred vision, double vision and pain.  Respiratory: Negative for cough, hemoptysis, shortness of breath and wheezing.   Cardiovascular: Negative for chest pain, palpitations, orthopnea and leg swelling.  Gastrointestinal: Negative for abdominal pain, constipation, diarrhea, heartburn, nausea and vomiting.  Genitourinary: Negative for dysuria and hematuria.  Musculoskeletal: Negative for back pain and joint pain.  Skin: Negative for rash.  Neurological: Negative for sensory change, speech change, focal weakness and headaches.  Endo/Heme/Allergies: Does not bruise/bleed easily.  Psychiatric/Behavioral: Negative for depression. The patient is not nervous/anxious.     DRUG ALLERGIES:   Allergies  Allergen Reactions  . Sulfa Antibiotics Hives    VITALS:  Blood pressure (!) 140/49, pulse (!) 46, temperature 98.2 F (36.8 C), temperature source Tympanic, resp. rate 16, height 4\' 11"  (1.499 m), weight 48.7 kg, SpO2 99 %.  PHYSICAL EXAMINATION:   Physical Exam  GENERAL:  73 y.o.-year-old patient lying in the bed with no acute distress.  EYES: Pupils equal, round, reactive to light and accommodation. No scleral icterus. Extraocular muscles intact.  HEENT: Head atraumatic, normocephalic. Oropharynx and nasopharynx clear.  NECK:  Supple, no jugular venous distention. No thyroid enlargement, no tenderness.  LUNGS: Normal breath sounds bilaterally, no wheezing, rales, rhonchi. No use of accessory muscles of respiration.  CARDIOVASCULAR: S1,  S2 . No murmurs, rubs, or gallops.  Bradycardia ABDOMEN: Soft, nontender, nondistended. Bowel sounds present. No organomegaly or mass.  EXTREMITIES: No cyanosis, clubbing or edema b/l.    NEUROLOGIC: Cranial nerves II through XII are intact. No focal Motor or sensory deficits b/l.   PSYCHIATRIC: The patient is alert and oriented x 3.  SKIN: No obvious rash, lesion, or ulcer.   LABORATORY PANEL:   CBC Recent Labs  Lab 12/16/18 1432  WBC 10.3  HGB 15.3*  HCT 45.2  PLT 320   ------------------------------------------------------------------------------------------------------------------ Chemistries  Recent Labs  Lab 12/16/18 1432  NA 141  K 4.0  CL 106  CO2 25  GLUCOSE 112*  BUN 15  CREATININE 0.77  CALCIUM 9.5  MG 2.5*   ------------------------------------------------------------------------------------------------------------------  Cardiac Enzymes Recent Labs  Lab 12/17/18 0908  TROPONINI <0.03   ------------------------------------------------------------------------------------------------------------------  RADIOLOGY:  No results found.   ASSESSMENT AND PLAN:   *Acute Mobitz type II AV block with symptomatic bradycardia Continue telemetry monitoring.  Scheduled for pacemaker today.   *Acute generalized weakness Secondary to above  *History of osteoarthritis Stable Tylenol as needed  *History of pre-hypertension Hydralazine as needed systolic blood pressure greater than 160  All the records are reviewed and case discussed with Care Management/Social Worker. Management plans discussed with the patient, family and they are in agreement.  CODE STATUS: Full code  DVT Prophylaxis: SCDs  TOTAL TIME TAKING CARE OF THIS PATIENT: 35 minutes.   Likely discharge in the morning  Orie FishermanSrikar R Dashawna Delbridge M.D on 12/18/2018 at 1:12 PM  Between 7am to 6pm - Pager - [phone]  After 6pm go to [email or web address] - password [email or web address] [facility]  [facility] [place] [facility]   Office  [phone]  CC: Primary care physician; Glendon Axe, MD  Note: This dictation was prepared with Dragon dictation along with smaller phrase technology. Any transcriptional errors that result from this process are unintentional.

## 2018-12-19 ENCOUNTER — Encounter: Payer: Self-pay | Admitting: Cardiology

## 2018-12-19 MED ORDER — CEPHALEXIN 500 MG PO CAPS: 500.0000 mg | ORAL_CAPSULE | Freq: Two times a day (BID) | ORAL | 0 refills | Status: AC

## 2018-12-19 MED ORDER — CEPHALEXIN 500 MG PO CAPS
500.0000 mg | ORAL_CAPSULE | Freq: Two times a day (BID) | ORAL | Status: DC
  Administered 2018-12-19: 500 mg via ORAL
  Filled 2018-12-19: qty 1

## 2018-12-19 NOTE — Discharge Summary (Signed)
[facility] - [place] at [facility]   PATIENT NAME: Megan HarrisBrenda Carroll    MR#:  [id number]  DATE OF BIRTH:  July 23, 1945  DATE OF ADMISSION:  12/16/2018 ADMITTING PHYSICIAN: Bertrum SolMontell D Salary, MD  DATE OF DISCHARGE: 12/19/2018 10:52 AM  PRIMARY CARE PHYSICIAN: Leotis ShamesSingh, Jasmine, MD   ADMISSION DIAGNOSIS:  Heart block [I45.9] Mobitz type 2 second degree atrioventricular block [I44.1]  DISCHARGE DIAGNOSIS:  Active Problems:   Mobitz type 2 second degree AV block   SECONDARY DIAGNOSIS:   Past Medical History:  Diagnosis Date  . Medical history non-contributory   . No pertinent past medical history      ADMITTING HISTORY  HISTORY OF PRESENT ILLNESS: Megan Carroll  is a 73 y.o. female with a known history per below presents emergency room with 2-day history of intermittent fatigue, patient stated on Sunday she noted her heart rate to be in the 38-72 range, was normal and yesterday, today her heart rate was in the 30s and she felt weak after mowing grass, in the emergency room work-up noted for second-degree AV block, case was discussed with Dr. Kohn/cardiology who recommended admission for further care, patient evaluated emergency room, no apparent distress, resting comfortably in bed, heart rate currently normal, patient now be admitted for acute Mobitz type II heart block with symptomatic bradycardia.  HOSPITAL COURSE:   *Acute Mobitz type II AV block with symptomatic bradycardia Admitted to telemetry monitoring unit.  Patient was seen by cardiology Dr. Lennette BihariKohn and later by Dr. Marina GoodellPerry shows.  Pacemaker placed on 12/18/2018.  Monitored overnight and today patient is doing well with minimal pain at the surgical site.  No fever or shortness of breath. As per cardiology patient was given prescription for Keflex for 1 week and discharged home to follow-up as outpatient.  *Acute generalized weakness Secondary to above.  Resolved  *History of osteoarthritis Stable Tylenol as  needed  Patient stable for discharge home to follow-up with cardiology and primary care physician.  CONSULTS OBTAINED:  Treatment Team:  Laurier NancyKhan, Shaukat A, MD Marcina MillardParaschos, Alexander, MD  DRUG ALLERGIES:   Allergies  Allergen Reactions  . Sulfa Antibiotics Hives    DISCHARGE MEDICATIONS:   Allergies as of 12/19/2018      Reactions   Sulfa Antibiotics Hives      Medication List    TAKE these medications   Calcium 600-D 600-400 MG-UNIT Tabs Generic drug: Calcium Carbonate-Vitamin D3 Take 1 tablet by mouth 2 (two) times daily with a meal.   cephALEXin 500 MG capsule Commonly known as: KEFLEX Take 1 capsule (500 mg total) by mouth 2 (two) times a day.   Glucosamine-Chondroitin 750-600 MG Tabs Take 1 tablet by mouth 2 (two) times daily.   multivitamin with minerals Tabs tablet Take 1 tablet by mouth daily.   vitamin B-12 1000 MCG tablet Commonly known as: CYANOCOBALAMIN Take 1,000 mcg by mouth daily.   vitamin C 500 MG tablet Commonly known as: ASCORBIC ACID Take 500 mg by mouth daily.       Today   VITAL SIGNS:  Blood pressure (!) 141/64, pulse 87, temperature 98.5 F (36.9 C), temperature source Oral, resp. rate 16, height 4\' 11"  (1.499 m), weight 48.7 kg, SpO2 94 %.  I/O:    Intake/Output Summary (Last 24 hours) at 12/19/2018 1333 Last data filed at 12/19/2018 0440 Gross per 24 hour  Intake 628.53 ml  Output 805 ml  Net -176.47 ml    PHYSICAL EXAMINATION:  Physical Exam  GENERAL:  73  y.o.-year-old patient lying in the bed with no acute distress.  LUNGS: Normal breath sounds bilaterally, no wheezing, rales,rhonchi or crepitation. No use of accessory muscles of respiration.  CARDIOVASCULAR: S1, S2 normal. No murmurs, rubs, or gallops.  ABDOMEN: Soft, non-tender, non-distended. Bowel sounds present. No organomegaly or mass.  NEUROLOGIC: Moves all 4 extremities. PSYCHIATRIC: The patient is alert and oriented x 3.  SKIN: No obvious rash, lesion, or  ulcer.   DATA REVIEW:   CBC Recent Labs  Lab 12/16/18 1432  WBC 10.3  HGB 15.3*  HCT 45.2  PLT 320    Chemistries  Recent Labs  Lab 12/16/18 1432  NA 141  K 4.0  CL 106  CO2 25  GLUCOSE 112*  BUN 15  CREATININE 0.77  CALCIUM 9.5  MG 2.5*    Cardiac Enzymes Recent Labs  Lab 12/17/18 0908  TROPONINI <0.03    Microbiology Results  Results for orders placed or performed during the hospital encounter of 12/16/18  Novel Coronavirus,NAA,(SEND-OUT TO REF LAB - TAT 24-48 hrs); Hosp Order     Status: None   Collection Time: 12/16/18  3:48 PM   Specimen: Nasopharyngeal Swab; Respiratory  Result Value Ref Range Status   SARS-CoV-2, NAA NOT DETECTED NOT DETECTED Final    Comment: (NOTE) This test was developed and its performance characteristics determined by [facility]. This test has not been FDA cleared or approved. This test has been authorized by FDA under an Emergency Use Authorization (EUA). This test is only authorized for the duration of time the declaration that circumstances exist justifying the authorization of the emergency use of in vitro diagnostic tests for detection of SARS-CoV-2 virus and/or diagnosis of COVID-19 infection under section 564(b)(1) of the Act, 21 U.S.C. 161WRU-0(A)(5360bbb-3(b)(1), unless the authorization is terminated or revoked sooner. When diagnostic testing is negative, the possibility of a false negative result should be considered in the context of a patient's recent exposures and the presence of clinical signs and symptoms consistent with COVID-19. An individual without symptoms of COVID-19 and who is not shedding SARS-CoV-2 virus would expect to have a negative (not detected) result in this assay. Performed  At: [facility] [address] [place], [place] [id number] Jolene SchimkeNagendra Sanjai MD NW:[id number]:[phone]    Coronavirus Source NASOPHARYNGEAL  Final    Comment: Performed at [facility], [address]  [place], [place] [address]  Surgical PCR screen     Status: None   Collection Time: 12/17/18 11:27 PM   Specimen: Nasal Mucosa; Nasal Swab  Result Value Ref Range Status   MRSA, PCR NEGATIVE NEGATIVE Final   Staphylococcus aureus NEGATIVE NEGATIVE Final    Comment: (NOTE) The Xpert SA Assay (FDA approved for NASAL specimens in patients 72 years of age and older), is one component of a comprehensive surveillance program. It is not intended to diagnose infection nor to guide or monitor treatment. Performed at [facility], [address] [place], [place] [address]     RADIOLOGY:  Dg Chest Port 1 View  Result Date: 12/18/2018 CLINICAL DATA:  Postop pacemaker insertion. EXAM: PORTABLE CHEST 1 VIEW COMPARISON:  None. FINDINGS: Dual lead left-sided pacemaker is intact and normally located. Lungs are adequately inflated without focal airspace consolidation, effusion or pneumothorax. Cardiomediastinal silhouette is within normal. Mild degenerative change of the spine. IMPRESSION: No acute cardiopulmonary disease. Placement of left-sided pacemaker. Electronically Signed   By: Elberta Fortisaniel  Boyle M.D.   On: 12/18/2018 14:31   Dg C-arm 1-60 Min-no Report  Result Date: 12/18/2018 Fluoroscopy was utilized by the requesting physician.  No radiographic interpretation.    Follow up with PCP in 1 week.  Management plans discussed with the patient, family and they are in agreement.  CODE STATUS:     Code Status Orders  (From admission, onward)         Start     Ordered   12/16/18 2110  Full code  Continuous     12/16/18 2109        Code Status History    This patient has a current code status but no historical code status.   Advance Care Planning Activity      TOTAL TIME TAKING CARE OF THIS PATIENT ON DAY OF DISCHARGE: more than 30 minutes.   Neita Carp M.D on 12/19/2018 at 1:33 PM  Between 7am to 6pm - Pager - [phone]  After 6pm go to [email or web address] - password [email or web address]  [place] [facility]  Office  [phone]  CC: Primary care physician; Glendon Axe, MD  Note: This dictation was prepared with Dragon dictation along with smaller phrase technology. Any transcriptional errors that result from this process are unintentional.

## 2018-12-19 NOTE — Care Management Important Message (Signed)
Important Message  Patient Details  Name: Megan Carroll MRN: [id number] Date of Birth: Aug 06, 1945   Medicare Important Message Given:  No  Patient discharged prior to arrival to unit to deliver concurrent Medicare IM.   Dannette Barbara 12/19/2018, 11:55 AM

## 2018-12-19 NOTE — Progress Notes (Signed)
SUBJECTIVE: Patient is feeling well, no chest pain or shortness of breath. No pain at incision site.   Vitals:   12/18/18 1534 12/18/18 2024 12/19/18 0439 12/19/18 0818  BP: 137/64 (!) 127/54 (!) 119/59 (!) 141/64  Pulse: 86 (!) 103 89 87  Resp: 18 18 18 16   Temp: 98.2 F (36.8 C) 98.4 F (36.9 C) 97.7 F (36.5 C) 98.5 F (36.9 C)  TempSrc: Oral Oral Oral Oral  SpO2: 96% 94% 95% 94%  Weight:      Height:        Intake/Output Summary (Last 24 hours) at 12/19/2018 0917 Last data filed at 12/19/2018 0440 Gross per 24 hour  Intake 631.53 ml  Output 1305 ml  Net -673.47 ml    LABS: Basic Metabolic Panel: Recent Labs    12/16/18 1432  NA 141  K 4.0  CL 106  CO2 25  GLUCOSE 112*  BUN 15  CREATININE 0.77  CALCIUM 9.5  MG 2.5*   Liver Function Tests: No results for input(s): AST, ALT, ALKPHOS, BILITOT, PROT, ALBUMIN in the last 72 hours. No results for input(s): LIPASE, AMYLASE in the last 72 hours. CBC: Recent Labs    12/16/18 1432  WBC 10.3  HGB 15.3*  HCT 45.2  MCV 94.6  PLT 320   Cardiac Enzymes: Recent Labs    12/16/18 2117 12/17/18 0305 12/17/18 0908  TROPONINI <0.03 <0.03 <0.03   BNP: Invalid input(s): POCBNP D-Dimer: No results for input(s): DDIMER in the last 72 hours. Hemoglobin A1C: No results for input(s): HGBA1C in the last 72 hours. Fasting Lipid Panel: No results for input(s): CHOL, HDL, LDLCALC, TRIG, CHOLHDL, LDLDIRECT in the last 72 hours. Thyroid Function Tests: Recent Labs    12/16/18 2117  TSH 2.946   Anemia Panel: No results for input(s): VITAMINB12, FOLATE, FERRITIN, TIBC, IRON, RETICCTPCT in the last 72 hours.   PHYSICAL EXAM General: Well developed, well nourished, in no acute distress HEENT:  Normocephalic and atramatic Neck:  No JVD.  Lungs: Clear bilaterally to auscultation and percussion. Heart: HRRR . Normal S1 and S2 without gallops or murmurs.  Abdomen: Bowel sounds are positive, abdomen soft and non-tender   Msk:  Back normal, normal gait. Normal strength and tone for age. Extremities: No clubbing, cyanosis or edema.   Neuro: Alert and oriented X 3. Psych:  Good affect, responds appropriately  TELEMETRY: Pace rhythm 89 bpm  ASSESSMENT AND PLAN:  1.  Status post dual-chamber pacemaker for Mobitz 2 second-degree AV block. Feeling well, may discharge home with outpatient follow up with Dr. Humphrey Rolls, Monday 10am.     Active Problems:   Mobitz type 2 second degree AV block    Jake Bathe, NP-C 12/19/2018 9:17 AM Cell: [phone]

## 2018-12-19 NOTE — Progress Notes (Signed)
Discharged to home with her family.  Post Pacemaker care explained to her.  She also got the instructions in writing.  Follow up appointments made.  She has one prescription to pick up at [facility].

## 2018-12-19 NOTE — Progress Notes (Signed)
[facility]  SUBJECTIVE: Laying in bed, reports feeling great   Vitals:   12/18/18 1449 12/18/18 1534 12/18/18 2024 12/19/18 0439  BP: (!) 143/69 137/64 (!) 127/54 (!) 119/59  Pulse: 83 86 (!) 103 89  Resp: 13 18 18 18   Temp: 98.2 F (36.8 C) 98.2 F (36.8 C) 98.4 F (36.9 C) 97.7 F (36.5 C)  TempSrc:  Oral Oral Oral  SpO2: 97% 96% 94% 95%  Weight:      Height:         Intake/Output Summary (Last 24 hours) at 12/19/2018 7829 Last data filed at 12/19/2018 0440 Gross per 24 hour  Intake 631.53 ml  Output 1305 ml  Net -673.47 ml      PHYSICAL EXAM  General: Well developed, well nourished, in no acute distress HEENT:  Normocephalic and atramatic Neck:  No JVD.  Lungs: Clear bilaterally to auscultation and percussion. Heart: HRRR . Normal S1 and S2 without gallops or murmurs.  Abdomen: Bowel sounds are positive, abdomen soft and non-tender  Msk:  Back normal, normal gait. Normal strength and tone for age. Extremities: No clubbing, cyanosis or edema.   Neuro: Alert and oriented X 3. Psych:  Good affect, responds appropriately   LABS: Basic Metabolic Panel: Recent Labs    12/16/18 1432  NA 141  K 4.0  CL 106  CO2 25  GLUCOSE 112*  BUN 15  CREATININE 0.77  CALCIUM 9.5  MG 2.5*   Liver Function Tests: No results for input(s): AST, ALT, ALKPHOS, BILITOT, PROT, ALBUMIN in the last 72 hours. No results for input(s): LIPASE, AMYLASE in the last 72 hours. CBC: Recent Labs    12/16/18 1432  WBC 10.3  HGB 15.3*  HCT 45.2  MCV 94.6  PLT 320   Cardiac Enzymes: Recent Labs    12/16/18 2117 12/17/18 0305 12/17/18 0908  TROPONINI <0.03 <0.03 <0.03   BNP: Invalid input(s): POCBNP D-Dimer: No results for input(s): DDIMER in the last 72 hours. Hemoglobin A1C: No results for input(s): HGBA1C in the last 72 hours. Fasting Lipid Panel: No results for input(s): CHOL, HDL, LDLCALC, TRIG, CHOLHDL, LDLDIRECT in the last 72 hours. Thyroid Function  Tests: Recent Labs    12/16/18 2117  TSH 2.946   Anemia Panel: No results for input(s): VITAMINB12, FOLATE, FERRITIN, TIBC, IRON, RETICCTPCT in the last 72 hours.  Dg Chest Port 1 View  Result Date: 12/18/2018 CLINICAL DATA:  Postop pacemaker insertion. EXAM: PORTABLE CHEST 1 VIEW COMPARISON:  None. FINDINGS: Dual lead left-sided pacemaker is intact and normally located. Lungs are adequately inflated without focal airspace consolidation, effusion or pneumothorax. Cardiomediastinal silhouette is within normal. Mild degenerative change of the spine. IMPRESSION: No acute cardiopulmonary disease. Placement of left-sided pacemaker. Electronically Signed   By: Marin Olp M.D.   On: 12/18/2018 14:31   Dg C-arm 1-60 Min-no Report  Result Date: 12/18/2018 Fluoroscopy was utilized by the requesting physician.  No radiographic interpretation.     Echo LVEF 60 to 65%  TELEMETRY: Atrial sensing with ventricular pacing:  ASSESSMENT AND PLAN:  Active Problems:   Mobitz type 2 second degree AV block    1.  Status post dual-chamber pacemaker for Mobitz 2 second-degree AV block  Recommendations  1.  May discharge home 2.  Keflex 500 mg twice daily for 7 days   Megan Cowman, MD, PhD, [facility] 12/19/2018 8:11 AM

## 2018-12-26 DIAGNOSIS — Z09 Encounter for follow-up examination after completed treatment for conditions other than malignant neoplasm: Secondary | ICD-10-CM | POA: Diagnosis not present

## 2018-12-26 DIAGNOSIS — I441 Atrioventricular block, second degree: Secondary | ICD-10-CM | POA: Diagnosis not present

## 2018-12-26 DIAGNOSIS — Z950 Presence of cardiac pacemaker: Secondary | ICD-10-CM | POA: Diagnosis not present

## 2018-12-26 DIAGNOSIS — R030 Elevated blood-pressure reading, without diagnosis of hypertension: Secondary | ICD-10-CM | POA: Diagnosis not present

## 2018-12-26 DIAGNOSIS — M810 Age-related osteoporosis without current pathological fracture: Secondary | ICD-10-CM | POA: Diagnosis not present

## 2019-03-26 DIAGNOSIS — R030 Elevated blood-pressure reading, without diagnosis of hypertension: Secondary | ICD-10-CM | POA: Diagnosis not present

## 2019-03-26 DIAGNOSIS — Z950 Presence of cardiac pacemaker: Secondary | ICD-10-CM | POA: Diagnosis not present

## 2019-03-26 DIAGNOSIS — I441 Atrioventricular block, second degree: Secondary | ICD-10-CM | POA: Diagnosis not present

## 2019-04-02 DIAGNOSIS — I441 Atrioventricular block, second degree: Secondary | ICD-10-CM | POA: Diagnosis not present

## 2019-04-15 ENCOUNTER — Other Ambulatory Visit: Payer: Self-pay | Admitting: Internal Medicine

## 2019-04-15 DIAGNOSIS — Z1231 Encounter for screening mammogram for malignant neoplasm of breast: Secondary | ICD-10-CM

## 2019-06-01 ENCOUNTER — Ambulatory Visit
Admission: RE | Admit: 2019-06-01 | Discharge: 2019-06-01 | Disposition: A | Discharge status: Home / Self Care | Payer: Medicare HMO | Source: Ambulatory Visit | Attending: Internal Medicine | Admitting: Internal Medicine

## 2019-06-01 DIAGNOSIS — Z1231 Encounter for screening mammogram for malignant neoplasm of breast: Secondary | ICD-10-CM | POA: Diagnosis not present

## 2019-07-16 DIAGNOSIS — Z13220 Encounter for screening for lipoid disorders: Secondary | ICD-10-CM | POA: Diagnosis not present

## 2019-07-16 DIAGNOSIS — Z131 Encounter for screening for diabetes mellitus: Secondary | ICD-10-CM | POA: Diagnosis not present

## 2019-07-16 DIAGNOSIS — R030 Elevated blood-pressure reading, without diagnosis of hypertension: Secondary | ICD-10-CM | POA: Diagnosis not present

## 2019-07-16 DIAGNOSIS — Z1329 Encounter for screening for other suspected endocrine disorder: Secondary | ICD-10-CM | POA: Diagnosis not present

## 2019-07-16 DIAGNOSIS — Z0000 Encounter for general adult medical examination without abnormal findings: Secondary | ICD-10-CM | POA: Diagnosis not present

## 2019-07-20 DIAGNOSIS — Z20822 Contact with and (suspected) exposure to covid-19: Secondary | ICD-10-CM | POA: Diagnosis not present

## 2019-08-06 DIAGNOSIS — Z950 Presence of cardiac pacemaker: Secondary | ICD-10-CM | POA: Diagnosis not present

## 2019-08-06 DIAGNOSIS — R030 Elevated blood-pressure reading, without diagnosis of hypertension: Secondary | ICD-10-CM | POA: Diagnosis not present

## 2019-08-06 DIAGNOSIS — I441 Atrioventricular block, second degree: Secondary | ICD-10-CM | POA: Diagnosis not present

## 2019-09-01 DIAGNOSIS — H2513 Age-related nuclear cataract, bilateral: Secondary | ICD-10-CM | POA: Diagnosis not present

## 2019-09-01 DIAGNOSIS — H524 Presbyopia: Secondary | ICD-10-CM | POA: Diagnosis not present

## 2019-09-01 DIAGNOSIS — H5203 Hypermetropia, bilateral: Secondary | ICD-10-CM | POA: Diagnosis not present

## 2019-09-22 DIAGNOSIS — I441 Atrioventricular block, second degree: Secondary | ICD-10-CM | POA: Diagnosis not present

## 2019-10-07 DIAGNOSIS — I441 Atrioventricular block, second degree: Secondary | ICD-10-CM | POA: Diagnosis not present

## 2019-10-16 DIAGNOSIS — I441 Atrioventricular block, second degree: Secondary | ICD-10-CM | POA: Diagnosis not present

## 2019-10-16 DIAGNOSIS — Z79899 Other long term (current) drug therapy: Secondary | ICD-10-CM | POA: Diagnosis not present

## 2019-10-16 DIAGNOSIS — M810 Age-related osteoporosis without current pathological fracture: Secondary | ICD-10-CM | POA: Diagnosis not present

## 2019-10-16 DIAGNOSIS — Z0000 Encounter for general adult medical examination without abnormal findings: Secondary | ICD-10-CM | POA: Diagnosis not present

## 2019-10-16 DIAGNOSIS — Z950 Presence of cardiac pacemaker: Secondary | ICD-10-CM | POA: Diagnosis not present

## 2020-02-16 DIAGNOSIS — I441 Atrioventricular block, second degree: Secondary | ICD-10-CM | POA: Diagnosis not present

## 2020-03-23 DIAGNOSIS — Z23 Encounter for immunization: Secondary | ICD-10-CM | POA: Diagnosis not present

## 2020-04-13 DIAGNOSIS — Z950 Presence of cardiac pacemaker: Secondary | ICD-10-CM | POA: Diagnosis not present

## 2020-04-13 DIAGNOSIS — Z0000 Encounter for general adult medical examination without abnormal findings: Secondary | ICD-10-CM | POA: Diagnosis not present

## 2020-04-13 DIAGNOSIS — I441 Atrioventricular block, second degree: Secondary | ICD-10-CM | POA: Diagnosis not present

## 2020-04-13 DIAGNOSIS — M810 Age-related osteoporosis without current pathological fracture: Secondary | ICD-10-CM | POA: Diagnosis not present

## 2020-04-18 ENCOUNTER — Other Ambulatory Visit: Payer: Self-pay | Admitting: Internal Medicine

## 2020-04-18 DIAGNOSIS — Z1231 Encounter for screening mammogram for malignant neoplasm of breast: Secondary | ICD-10-CM

## 2020-04-20 DIAGNOSIS — Z950 Presence of cardiac pacemaker: Secondary | ICD-10-CM | POA: Diagnosis not present

## 2020-04-20 DIAGNOSIS — Z1212 Encounter for screening for malignant neoplasm of rectum: Secondary | ICD-10-CM | POA: Diagnosis not present

## 2020-04-20 DIAGNOSIS — I441 Atrioventricular block, second degree: Secondary | ICD-10-CM | POA: Diagnosis not present

## 2020-04-20 DIAGNOSIS — Z1211 Encounter for screening for malignant neoplasm of colon: Secondary | ICD-10-CM | POA: Diagnosis not present

## 2020-04-20 DIAGNOSIS — B028 Zoster with other complications: Secondary | ICD-10-CM | POA: Diagnosis not present

## 2020-04-20 DIAGNOSIS — R030 Elevated blood-pressure reading, without diagnosis of hypertension: Secondary | ICD-10-CM | POA: Diagnosis not present

## 2020-04-20 DIAGNOSIS — M810 Age-related osteoporosis without current pathological fracture: Secondary | ICD-10-CM | POA: Diagnosis not present

## 2020-05-19 DIAGNOSIS — Z1212 Encounter for screening for malignant neoplasm of rectum: Secondary | ICD-10-CM | POA: Diagnosis not present

## 2020-05-19 DIAGNOSIS — Z1211 Encounter for screening for malignant neoplasm of colon: Secondary | ICD-10-CM | POA: Diagnosis not present

## 2020-05-29 LAB — COLOGUARD: COLOGUARD: POSITIVE — AB

## 2020-06-01 ENCOUNTER — Other Ambulatory Visit: Payer: Self-pay

## 2020-06-01 ENCOUNTER — Ambulatory Visit
Admission: RE | Admit: 2020-06-01 | Discharge: 2020-06-01 | Disposition: A | Discharge status: Home / Self Care | Payer: Medicare HMO | Source: Ambulatory Visit | Attending: Internal Medicine | Admitting: Internal Medicine

## 2020-06-01 DIAGNOSIS — Z1231 Encounter for screening mammogram for malignant neoplasm of breast: Secondary | ICD-10-CM | POA: Diagnosis not present

## 2020-07-19 DIAGNOSIS — I441 Atrioventricular block, second degree: Secondary | ICD-10-CM | POA: Diagnosis not present

## 2020-09-01 DIAGNOSIS — R195 Other fecal abnormalities: Secondary | ICD-10-CM | POA: Diagnosis not present

## 2020-09-01 DIAGNOSIS — M810 Age-related osteoporosis without current pathological fracture: Secondary | ICD-10-CM | POA: Diagnosis not present

## 2020-09-01 DIAGNOSIS — I441 Atrioventricular block, second degree: Secondary | ICD-10-CM | POA: Diagnosis not present

## 2020-09-01 DIAGNOSIS — Z950 Presence of cardiac pacemaker: Secondary | ICD-10-CM | POA: Diagnosis not present

## 2020-10-04 DIAGNOSIS — H2513 Age-related nuclear cataract, bilateral: Secondary | ICD-10-CM | POA: Diagnosis not present

## 2020-10-04 DIAGNOSIS — H524 Presbyopia: Secondary | ICD-10-CM | POA: Diagnosis not present

## 2020-10-04 DIAGNOSIS — H52223 Regular astigmatism, bilateral: Secondary | ICD-10-CM | POA: Diagnosis not present

## 2020-10-04 DIAGNOSIS — H5203 Hypermetropia, bilateral: Secondary | ICD-10-CM | POA: Diagnosis not present

## 2020-10-12 DIAGNOSIS — I441 Atrioventricular block, second degree: Secondary | ICD-10-CM | POA: Diagnosis not present

## 2020-10-12 DIAGNOSIS — M810 Age-related osteoporosis without current pathological fracture: Secondary | ICD-10-CM | POA: Diagnosis not present

## 2020-10-12 DIAGNOSIS — B028 Zoster with other complications: Secondary | ICD-10-CM | POA: Diagnosis not present

## 2020-10-12 DIAGNOSIS — Z950 Presence of cardiac pacemaker: Secondary | ICD-10-CM | POA: Diagnosis not present

## 2020-10-12 DIAGNOSIS — R030 Elevated blood-pressure reading, without diagnosis of hypertension: Secondary | ICD-10-CM | POA: Diagnosis not present

## 2020-10-19 DIAGNOSIS — I10 Essential (primary) hypertension: Secondary | ICD-10-CM | POA: Diagnosis not present

## 2020-10-19 DIAGNOSIS — M810 Age-related osteoporosis without current pathological fracture: Secondary | ICD-10-CM | POA: Diagnosis not present

## 2020-10-19 DIAGNOSIS — I441 Atrioventricular block, second degree: Secondary | ICD-10-CM | POA: Diagnosis not present

## 2020-10-19 DIAGNOSIS — Z950 Presence of cardiac pacemaker: Secondary | ICD-10-CM | POA: Diagnosis not present

## 2020-10-19 DIAGNOSIS — Z0000 Encounter for general adult medical examination without abnormal findings: Secondary | ICD-10-CM | POA: Diagnosis not present

## 2020-10-19 DIAGNOSIS — R195 Other fecal abnormalities: Secondary | ICD-10-CM | POA: Diagnosis not present

## 2020-10-19 DIAGNOSIS — Z96652 Presence of left artificial knee joint: Secondary | ICD-10-CM | POA: Diagnosis not present

## 2020-10-19 DIAGNOSIS — Z79899 Other long term (current) drug therapy: Secondary | ICD-10-CM | POA: Diagnosis not present

## 2020-11-01 ENCOUNTER — Encounter: Payer: Self-pay | Admitting: Internal Medicine

## 2020-11-02 ENCOUNTER — Other Ambulatory Visit: Payer: Self-pay

## 2020-11-02 ENCOUNTER — Ambulatory Visit: Payer: Medicare HMO | Admitting: Registered Nurse

## 2020-11-02 ENCOUNTER — Ambulatory Visit
Admission: RE | Admit: 2020-11-02 | Discharge: 2020-11-02 | Disposition: A | Discharge status: Home / Self Care | Payer: Medicare HMO | Attending: Internal Medicine | Admitting: Internal Medicine

## 2020-11-02 ENCOUNTER — Encounter
Admission: RE | Disposition: A | Discharge status: Home / Self Care | Payer: Self-pay | Source: Home / Self Care | Attending: Internal Medicine

## 2020-11-02 ENCOUNTER — Encounter: Payer: Self-pay | Admitting: Internal Medicine

## 2020-11-02 DIAGNOSIS — Z950 Presence of cardiac pacemaker: Secondary | ICD-10-CM | POA: Insufficient documentation

## 2020-11-02 DIAGNOSIS — K640 First degree hemorrhoids: Secondary | ICD-10-CM | POA: Diagnosis not present

## 2020-11-02 DIAGNOSIS — Z79899 Other long term (current) drug therapy: Secondary | ICD-10-CM | POA: Insufficient documentation

## 2020-11-02 DIAGNOSIS — Z882 Allergy status to sulfonamides status: Secondary | ICD-10-CM | POA: Insufficient documentation

## 2020-11-02 DIAGNOSIS — K648 Other hemorrhoids: Secondary | ICD-10-CM | POA: Diagnosis not present

## 2020-11-02 DIAGNOSIS — R195 Other fecal abnormalities: Secondary | ICD-10-CM | POA: Insufficient documentation

## 2020-11-02 HISTORY — DX: Atrioventricular block, second degree: I44.1

## 2020-11-02 HISTORY — PX: COLONOSCOPY WITH PROPOFOL: SHX5780

## 2020-11-02 HISTORY — DX: Menopausal and female climacteric states: N95.1

## 2020-11-02 HISTORY — DX: Elevated blood-pressure reading, without diagnosis of hypertension: R03.0

## 2020-11-02 HISTORY — DX: Presence of cardiac pacemaker: Z95.0

## 2020-11-02 HISTORY — DX: Unspecified osteoarthritis, unspecified site: M19.90

## 2020-11-02 HISTORY — DX: Varicella without complication: B01.9

## 2020-11-02 HISTORY — DX: Allergy, unspecified, initial encounter: T78.40XA

## 2020-11-02 SURGERY — COLONOSCOPY WITH PROPOFOL
Anesthesia: Monitor Anesthesia Care

## 2020-11-02 MED ORDER — SODIUM CHLORIDE 0.9 % IV SOLN: INTRAVENOUS | Status: DC

## 2020-11-02 MED ORDER — LIDOCAINE HCL (CARDIAC) PF 100 MG/5ML IV SOSY
PREFILLED_SYRINGE | INTRAVENOUS | Status: DC | PRN
  Administered 2020-11-02: 80 mg via INTRAVENOUS

## 2020-11-02 MED ORDER — PROPOFOL 10 MG/ML IV BOLUS
INTRAVENOUS | Status: DC | PRN
  Administered 2020-11-02: 50 mg via INTRAVENOUS
  Administered 2020-11-02: 20 mg via INTRAVENOUS

## 2020-11-02 MED ORDER — PROPOFOL 500 MG/50ML IV EMUL
INTRAVENOUS | Status: AC
  Filled 2020-11-02: qty 100

## 2020-11-02 MED ORDER — PROPOFOL 500 MG/50ML IV EMUL
INTRAVENOUS | Status: DC | PRN
  Administered 2020-11-02: 150 ug/kg/min via INTRAVENOUS

## 2020-11-02 NOTE — Transfer of Care (Signed)
Immediate Anesthesia Transfer of Care Note  Patient: Megan Carroll  Procedure(s) Performed: COLONOSCOPY WITH PROPOFOL (N/A )  Patient Location: Endoscopy Unit  Anesthesia Type:General  Level of Consciousness: drowsy  Airway & Oxygen Therapy: Patient Spontanous Breathing  Post-op Assessment: Report given to RN and Post -op Vital signs reviewed and stable  Post vital signs: Reviewed and stable  Last Vitals:  Vitals Value Taken Time  BP 103/55 11/02/20 1314  Temp 36.4 C 11/02/20 1314  Pulse 51 11/02/20 1316  Resp 25 11/02/20 1316  SpO2 96 % 11/02/20 1316  Vitals shown include unvalidated device data.  Last Pain:  Vitals:   11/02/20 1314  TempSrc: Temporal  PainSc: 0-No pain         Complications: No complications documented.

## 2020-11-02 NOTE — H&P (Signed)
Outpatient short stay form Pre-procedure 11/02/2020 12:39 PM Rowen Hur K. Norma Fredrickson, M.D.  Primary Physician: Barbette Reichmann, M.D.  Reason for visit: Positive cologuard stool test.  History of present illness:  Patient is a pleasant69 y/o female presenting on referral from primary care provider for a POSITIVE Cologuard result. Patient denies change in bowel habits, rectal bleeding, weight loss or abdominal pain.       Current Facility-Administered Medications:  .  0.9 %  sodium chloride infusion, , Intravenous, Continuous, [place], Boykin Nearing, MD, Last Rate: 20 mL/hr at 11/02/20 1226, New Bag at 11/02/20 1226  Medications Prior to Admission  Medication Sig Dispense Refill Last Dose  . Calcium Carbonate-Vitamin D3 600-400 MG-UNIT TABS Take 1 tablet by mouth 2 (two) times daily with a meal.   Past Week at Unknown time  . Glucosamine-Chondroitin 750-600 MG TABS Take 1 tablet by mouth 2 (two) times daily.   Past Week at Unknown time  . Multiple Vitamin (MULTIVITAMIN WITH MINERALS) TABS tablet Take 1 tablet by mouth daily.   Past Week at Unknown time  . vitamin B-12 (CYANOCOBALAMIN) 1000 MCG tablet Take 1,000 mcg by mouth daily.   Past Week at Unknown time  . vitamin C (ASCORBIC ACID) 500 MG tablet Take 500 mg by mouth daily.   Past Week at Unknown time  . cephALEXin (KEFLEX) 500 MG capsule Take 1 capsule (500 mg total) by mouth 2 (two) times a day. (Patient not taking: Reported on 11/02/2020) 14 capsule 0 Completed Course at Unknown time  . gabapentin (NEURONTIN) 100 MG capsule Take 100 mg by mouth 3 (three) times daily. (Patient not taking: Reported on 11/02/2020)   Not Taking at Unknown time     Allergies  Allergen Reactions  . Sulfa Antibiotics Hives     Past Medical History:  Diagnosis Date  . Allergies   . Arthritis   . Chickenpox   . Medical history non-contributory   . Menopausal syndrome   . Mobitz type 2 second degree AV block   . No pertinent past medical history   . Presence  of permanent cardiac pacemaker   . White coat syndrome without diagnosis of hypertension     Review of systems:  Otherwise negative.    Physical Exam  Gen: Alert, oriented. Appears stated age.  HEENT: [place]/AT. PERRLA. Lungs: CTA, no wheezes. CV: RR nl S1, S2. Abd: soft, benign, no masses. BS+ Ext: No edema. Pulses 2+    Planned procedures: Proceed with colonoscopy. The patient understands the nature of the planned procedure, indications, risks, alternatives and potential complications including but not limited to bleeding, infection, perforation, damage to internal organs and possible oversedation/side effects from anesthesia. The patient agrees and gives consent to proceed.  Please refer to procedure notes for findings, recommendations and patient disposition/instructions.     Arman Loy K. Norma Fredrickson, M.D. Gastroenterology 11/02/2020  12:39 PM

## 2020-11-02 NOTE — Op Note (Signed)
[facility] Gastroenterology Patient Name: Megan Carroll Procedure Date: 11/02/2020 12:30 PM MRN: [id number] Account #: [id number] Date of Birth: July 18, 1945 Admit Type: Outpatient Age: 75 Room: [facility] ENDO ROOM 2 Gender: Female Note Status: Finalized Procedure:             Colonoscopy Indications:           Positive Cologuard test Providers:             Boykin Nearing. Norma Fredrickson MD, MD Referring MD:          Barbette Reichmann, MD (Referring MD) Medicines:             Propofol per Anesthesia Complications:         No immediate complications. Procedure:             Pre-Anesthesia Assessment:                        - The risks and benefits of the procedure and the                         sedation options and risks were discussed with the                         patient. All questions were answered and informed                         consent was obtained.                        - Patient identification and proposed procedure were                         verified prior to the procedure by the nurse. The                         procedure was verified in the procedure room.                        - ASA Grade Assessment: III - A patient with severe                         systemic disease.                        - After reviewing the risks and benefits, the patient                         was deemed in satisfactory condition to undergo the                         procedure.                        After obtaining informed consent, the colonoscope was                         passed under direct vision. Throughout the procedure,                         the patient's blood pressure, pulse, and oxygen  saturations were monitored continuously. The                         Colonoscope was introduced through the anus and                         advanced to the the cecum, identified by appendiceal                         orifice and ileocecal valve. The  colonoscopy was                         performed without difficulty. The patient tolerated                         the procedure well. The quality of the bowel                         preparation was good. The ileocecal valve, appendiceal                         orifice, and rectum were photographed. Findings:      The perianal and digital rectal examinations were normal. Pertinent       negatives include normal sphincter tone and no palpable rectal lesions.      Non-bleeding internal hemorrhoids were found during retroflexion. The       hemorrhoids were Grade I (internal hemorrhoids that do not prolapse).      The colon (entire examined portion) appeared normal.      There is no endoscopic evidence of bleeding, inflammation, mass or       polyps in the entire colon. Impression:            - Non-bleeding internal hemorrhoids.                        - The entire examined colon is normal.                        - No specimens collected. Recommendation:        - Patient has a contact number available for                         emergencies. The signs and symptoms of potential                         delayed complications were discussed with the patient.                         Return to normal activities tomorrow. Written                         discharge instructions were provided to the patient.                        - Resume previous diet.                        - Continue present medications.                        -  No repeat colonoscopy due to current age (50 years                         or older), the absence of advanced adenomas and no                         evidence of mucosal or other abnormalities on today's                         exam.                        - Return to GI office PRN.                        - You do NOT require further colon cancer screening                         measures (Annual stool testing (i.e. hemoccult, FIT,                         cologuard),  sigmoidoscopy, colonoscopy or CT                         colonography). You should share this recommendation                         with your Primary Care provider.                        - The findings and recommendations were discussed with                         the patient. Procedure Code(s):     --- Professional ---                        [id number], Colonoscopy, flexible; diagnostic, including                         collection of specimen(s) by brushing or washing, when                         performed (separate procedure) Diagnosis Code(s):     --- Professional ---                        R19.5, Other fecal abnormalities                        K64.0, First degree hemorrhoids CPT copyright 2019 [employer]. All rights reserved. The codes documented in this report are preliminary and upon coder review may  be revised to meet current compliance requirements. Stanton Kidney MD, MD 11/02/2020 1:16:09 PM This report has been signed electronically. Number of Addenda: 0 Note Initiated On: 11/02/2020 12:30 PM Scope Withdrawal Time: 0 hours 8 minutes 38 seconds  Total Procedure Duration: 0 hours 15 minutes 6 seconds  Estimated Blood Loss:  Estimated blood loss: none.      [facility]

## 2020-11-02 NOTE — Anesthesia Preprocedure Evaluation (Signed)
Anesthesia Evaluation  Patient identified by MRN, date of birth, ID band Patient awake    Reviewed: Allergy & Precautions, H&P , NPO status , Patient's Chart, lab work & pertinent test results, reviewed documented beta blocker date and time   History of Anesthesia Complications Negative for: history of anesthetic complications  Airway Mallampati: II  TM Distance: >3 FB Neck ROM: limited    Dental  (+) Chipped   Pulmonary neg pulmonary ROS, neg shortness of breath,    Pulmonary exam normal        Cardiovascular Exercise Tolerance: Good hypertension, Normal cardiovascular exam+ dysrhythmias + pacemaker      Neuro/Psych negative neurological ROS  negative psych ROS   GI/Hepatic negative GI ROS, Neg liver ROS, neg GERD  ,  Endo/Other  negative endocrine ROS  Renal/GU negative Renal ROS  negative genitourinary   Musculoskeletal  (+) Arthritis ,   Abdominal   Peds  Hematology negative hematology ROS (+)   Anesthesia Other Findings Past Medical History: No date: Medical history non-contributory No date: No pertinent past medical history Mobitz II EF 60-65 HR 37-40. Otherwise healthy lady.   Reproductive/Obstetrics negative OB ROS                             Anesthesia Physical  Anesthesia Plan  ASA: III  Anesthesia Plan: General and MAC   Post-op Pain Management:    Induction: Intravenous  PONV Risk Score and Plan: Propofol infusion and TIVA  Airway Management Planned: Natural Airway and Nasal Cannula  Additional Equipment:   Intra-op Plan:   Post-operative Plan:   Informed Consent: I have reviewed the patients History and Physical, chart, labs and discussed the procedure including the risks, benefits and alternatives for the proposed anesthesia with the patient or authorized representative who has indicated his/her understanding and acceptance.     Dental Advisory  Given  Plan Discussed with: CRNA  Anesthesia Plan Comments: (Patient consented for risks of anesthesia including but not limited to:  - adverse reactions to medications - risk of airway placement if required - damage to eyes, teeth, lips or other oral mucosa - nerve damage due to positioning  - sore throat or hoarseness - Damage to heart, brain, nerves, lungs, other parts of body or loss of life  Patient voiced understanding.)        Anesthesia Quick Evaluation

## 2020-11-02 NOTE — Interval H&P Note (Signed)
History and Physical Interval Note:  11/02/2020 12:40 PM  Megan Carroll  has presented today for surgery, with the diagnosis of positive cologurad stool test.  The various methods of treatment have been discussed with the patient and family. After consideration of risks, benefits and other options for treatment, the patient has consented to  Procedure(s): COLONOSCOPY WITH PROPOFOL (N/A) as a surgical intervention.  The patient's history has been reviewed, patient examined, no change in status, stable for surgery.  I have reviewed the patient's chart and labs.  Questions were answered to the patient's satisfaction.     [place], [place]

## 2020-11-03 ENCOUNTER — Encounter: Payer: Self-pay | Admitting: Internal Medicine

## 2020-11-03 NOTE — Anesthesia Postprocedure Evaluation (Signed)
Anesthesia Post Note  Patient: Megan Carroll  Procedure(s) Performed: COLONOSCOPY WITH PROPOFOL (N/A )  Patient location during evaluation: Endoscopy Anesthesia Type: MAC Level of consciousness: awake and alert Pain management: pain level controlled Vital Signs Assessment: post-procedure vital signs reviewed and stable Respiratory status: spontaneous breathing, nonlabored ventilation, respiratory function stable and patient connected to nasal cannula oxygen Cardiovascular status: blood pressure returned to baseline and stable Postop Assessment: no apparent nausea or vomiting Anesthetic complications: no   No complications documented.   Last Vitals:  Vitals:   11/02/20 1324 11/02/20 1334  BP: 110/64 128/69  Pulse: (!) 47 (!) 56  Resp: 14 15  Temp:    SpO2: 97% 98%    Last Pain:  Vitals:   11/02/20 1334  TempSrc:   PainSc: 0-No pain                 Precious Haws Akiyah Eppolito

## 2020-12-20 DIAGNOSIS — I441 Atrioventricular block, second degree: Secondary | ICD-10-CM | POA: Diagnosis not present

## 2021-04-12 DIAGNOSIS — R030 Elevated blood-pressure reading, without diagnosis of hypertension: Secondary | ICD-10-CM | POA: Diagnosis not present

## 2021-04-12 DIAGNOSIS — Z950 Presence of cardiac pacemaker: Secondary | ICD-10-CM | POA: Diagnosis not present

## 2021-04-12 DIAGNOSIS — M810 Age-related osteoporosis without current pathological fracture: Secondary | ICD-10-CM | POA: Diagnosis not present

## 2021-04-12 DIAGNOSIS — R195 Other fecal abnormalities: Secondary | ICD-10-CM | POA: Diagnosis not present

## 2021-04-12 DIAGNOSIS — Z96652 Presence of left artificial knee joint: Secondary | ICD-10-CM | POA: Diagnosis not present

## 2021-04-12 DIAGNOSIS — I441 Atrioventricular block, second degree: Secondary | ICD-10-CM | POA: Diagnosis not present

## 2021-04-14 ENCOUNTER — Other Ambulatory Visit: Payer: Self-pay | Admitting: Internal Medicine

## 2021-04-14 DIAGNOSIS — Z1231 Encounter for screening mammogram for malignant neoplasm of breast: Secondary | ICD-10-CM

## 2021-04-19 DIAGNOSIS — Z950 Presence of cardiac pacemaker: Secondary | ICD-10-CM | POA: Diagnosis not present

## 2021-04-19 DIAGNOSIS — Z96652 Presence of left artificial knee joint: Secondary | ICD-10-CM | POA: Diagnosis not present

## 2021-04-19 DIAGNOSIS — M810 Age-related osteoporosis without current pathological fracture: Secondary | ICD-10-CM | POA: Diagnosis not present

## 2021-04-19 DIAGNOSIS — I441 Atrioventricular block, second degree: Secondary | ICD-10-CM | POA: Diagnosis not present

## 2021-05-24 DIAGNOSIS — I441 Atrioventricular block, second degree: Secondary | ICD-10-CM | POA: Diagnosis not present

## 2021-06-05 ENCOUNTER — Other Ambulatory Visit: Payer: Self-pay

## 2021-06-05 ENCOUNTER — Ambulatory Visit
Admission: RE | Admit: 2021-06-05 | Discharge: 2021-06-05 | Disposition: A | Discharge status: Home / Self Care | Payer: Medicare HMO | Source: Ambulatory Visit | Attending: Internal Medicine | Admitting: Internal Medicine

## 2021-06-05 DIAGNOSIS — Z1231 Encounter for screening mammogram for malignant neoplasm of breast: Secondary | ICD-10-CM | POA: Diagnosis not present

## 2021-10-09 DIAGNOSIS — H5203 Hypermetropia, bilateral: Secondary | ICD-10-CM | POA: Diagnosis not present

## 2021-10-09 DIAGNOSIS — H52223 Regular astigmatism, bilateral: Secondary | ICD-10-CM | POA: Diagnosis not present

## 2021-10-09 DIAGNOSIS — H524 Presbyopia: Secondary | ICD-10-CM | POA: Diagnosis not present

## 2021-10-09 DIAGNOSIS — H2513 Age-related nuclear cataract, bilateral: Secondary | ICD-10-CM | POA: Diagnosis not present

## 2021-10-17 DIAGNOSIS — Z96652 Presence of left artificial knee joint: Secondary | ICD-10-CM | POA: Diagnosis not present

## 2021-10-17 DIAGNOSIS — Z950 Presence of cardiac pacemaker: Secondary | ICD-10-CM | POA: Diagnosis not present

## 2021-10-17 DIAGNOSIS — I441 Atrioventricular block, second degree: Secondary | ICD-10-CM | POA: Diagnosis not present

## 2021-10-17 DIAGNOSIS — M810 Age-related osteoporosis without current pathological fracture: Secondary | ICD-10-CM | POA: Diagnosis not present

## 2021-10-24 DIAGNOSIS — M5416 Radiculopathy, lumbar region: Secondary | ICD-10-CM | POA: Diagnosis not present

## 2021-10-24 DIAGNOSIS — I441 Atrioventricular block, second degree: Secondary | ICD-10-CM | POA: Diagnosis not present

## 2021-10-24 DIAGNOSIS — Z950 Presence of cardiac pacemaker: Secondary | ICD-10-CM | POA: Diagnosis not present

## 2021-10-24 DIAGNOSIS — Z9181 History of falling: Secondary | ICD-10-CM | POA: Diagnosis not present

## 2021-10-24 DIAGNOSIS — M810 Age-related osteoporosis without current pathological fracture: Secondary | ICD-10-CM | POA: Diagnosis not present

## 2021-10-24 DIAGNOSIS — Z1389 Encounter for screening for other disorder: Secondary | ICD-10-CM | POA: Diagnosis not present

## 2021-10-24 DIAGNOSIS — Z0000 Encounter for general adult medical examination without abnormal findings: Secondary | ICD-10-CM | POA: Diagnosis not present

## 2022-04-18 DIAGNOSIS — I441 Atrioventricular block, second degree: Secondary | ICD-10-CM | POA: Diagnosis not present

## 2022-04-18 DIAGNOSIS — M818 Other osteoporosis without current pathological fracture: Secondary | ICD-10-CM | POA: Diagnosis not present

## 2022-04-18 DIAGNOSIS — I10 Essential (primary) hypertension: Secondary | ICD-10-CM | POA: Diagnosis not present

## 2022-04-18 DIAGNOSIS — M5416 Radiculopathy, lumbar region: Secondary | ICD-10-CM | POA: Diagnosis not present

## 2022-04-18 DIAGNOSIS — Z950 Presence of cardiac pacemaker: Secondary | ICD-10-CM | POA: Diagnosis not present

## 2022-04-23 ENCOUNTER — Other Ambulatory Visit: Payer: Self-pay | Admitting: Internal Medicine

## 2022-04-23 DIAGNOSIS — Z1231 Encounter for screening mammogram for malignant neoplasm of breast: Secondary | ICD-10-CM

## 2022-04-25 DIAGNOSIS — Z950 Presence of cardiac pacemaker: Secondary | ICD-10-CM | POA: Diagnosis not present

## 2022-04-25 DIAGNOSIS — I441 Atrioventricular block, second degree: Secondary | ICD-10-CM | POA: Diagnosis not present

## 2022-04-25 DIAGNOSIS — I10 Essential (primary) hypertension: Secondary | ICD-10-CM | POA: Diagnosis not present

## 2022-06-06 ENCOUNTER — Ambulatory Visit
Admission: RE | Admit: 2022-06-06 | Discharge: 2022-06-06 | Disposition: A | Discharge status: Home / Self Care | Payer: Medicare HMO | Source: Ambulatory Visit | Attending: Internal Medicine | Admitting: Internal Medicine

## 2022-06-06 DIAGNOSIS — Z1231 Encounter for screening mammogram for malignant neoplasm of breast: Secondary | ICD-10-CM | POA: Diagnosis not present

## 2022-09-19 DIAGNOSIS — I441 Atrioventricular block, second degree: Secondary | ICD-10-CM | POA: Diagnosis not present

## 2022-09-19 DIAGNOSIS — Z950 Presence of cardiac pacemaker: Secondary | ICD-10-CM | POA: Diagnosis not present

## 2022-10-11 DIAGNOSIS — H5203 Hypermetropia, bilateral: Secondary | ICD-10-CM | POA: Diagnosis not present

## 2022-10-11 DIAGNOSIS — H2513 Age-related nuclear cataract, bilateral: Secondary | ICD-10-CM | POA: Diagnosis not present

## 2022-10-11 DIAGNOSIS — H524 Presbyopia: Secondary | ICD-10-CM | POA: Diagnosis not present

## 2022-10-11 DIAGNOSIS — H52223 Regular astigmatism, bilateral: Secondary | ICD-10-CM | POA: Diagnosis not present

## 2022-10-31 DIAGNOSIS — R030 Elevated blood-pressure reading, without diagnosis of hypertension: Secondary | ICD-10-CM | POA: Diagnosis not present

## 2022-10-31 DIAGNOSIS — Z950 Presence of cardiac pacemaker: Secondary | ICD-10-CM | POA: Diagnosis not present

## 2022-10-31 DIAGNOSIS — I441 Atrioventricular block, second degree: Secondary | ICD-10-CM | POA: Diagnosis not present

## 2022-11-07 DIAGNOSIS — Z1331 Encounter for screening for depression: Secondary | ICD-10-CM | POA: Diagnosis not present

## 2022-11-07 DIAGNOSIS — Z950 Presence of cardiac pacemaker: Secondary | ICD-10-CM | POA: Diagnosis not present

## 2022-11-07 DIAGNOSIS — R030 Elevated blood-pressure reading, without diagnosis of hypertension: Secondary | ICD-10-CM | POA: Diagnosis not present

## 2022-11-07 DIAGNOSIS — Z0000 Encounter for general adult medical examination without abnormal findings: Secondary | ICD-10-CM | POA: Diagnosis not present

## 2022-11-07 DIAGNOSIS — M810 Age-related osteoporosis without current pathological fracture: Secondary | ICD-10-CM | POA: Diagnosis not present

## 2023-03-19 DIAGNOSIS — I441 Atrioventricular block, second degree: Secondary | ICD-10-CM | POA: Diagnosis not present

## 2023-04-26 ENCOUNTER — Other Ambulatory Visit: Payer: Self-pay | Admitting: Internal Medicine

## 2023-04-26 DIAGNOSIS — Z1231 Encounter for screening mammogram for malignant neoplasm of breast: Secondary | ICD-10-CM

## 2023-06-12 ENCOUNTER — Ambulatory Visit
Admission: RE | Admit: 2023-06-12 | Discharge: 2023-06-12 | Disposition: A | Discharge status: Home / Self Care | Payer: Medicare HMO | Source: Ambulatory Visit | Attending: Internal Medicine | Admitting: Internal Medicine

## 2023-06-12 DIAGNOSIS — Z1231 Encounter for screening mammogram for malignant neoplasm of breast: Secondary | ICD-10-CM | POA: Insufficient documentation

## 2023-08-14 DIAGNOSIS — Z950 Presence of cardiac pacemaker: Secondary | ICD-10-CM | POA: Diagnosis not present

## 2023-08-14 DIAGNOSIS — I441 Atrioventricular block, second degree: Secondary | ICD-10-CM | POA: Diagnosis not present

## 2023-10-11 DIAGNOSIS — H52223 Regular astigmatism, bilateral: Secondary | ICD-10-CM | POA: Diagnosis not present

## 2023-10-11 DIAGNOSIS — H524 Presbyopia: Secondary | ICD-10-CM | POA: Diagnosis not present

## 2023-10-11 DIAGNOSIS — H2513 Age-related nuclear cataract, bilateral: Secondary | ICD-10-CM | POA: Diagnosis not present

## 2023-10-11 DIAGNOSIS — H5203 Hypermetropia, bilateral: Secondary | ICD-10-CM | POA: Diagnosis not present

## 2023-11-01 DIAGNOSIS — R030 Elevated blood-pressure reading, without diagnosis of hypertension: Secondary | ICD-10-CM | POA: Diagnosis not present

## 2023-11-01 DIAGNOSIS — Z950 Presence of cardiac pacemaker: Secondary | ICD-10-CM | POA: Diagnosis not present

## 2023-11-01 DIAGNOSIS — Z0000 Encounter for general adult medical examination without abnormal findings: Secondary | ICD-10-CM | POA: Diagnosis not present

## 2023-11-08 DIAGNOSIS — I441 Atrioventricular block, second degree: Secondary | ICD-10-CM | POA: Diagnosis not present

## 2023-11-08 DIAGNOSIS — R030 Elevated blood-pressure reading, without diagnosis of hypertension: Secondary | ICD-10-CM | POA: Diagnosis not present

## 2023-11-08 DIAGNOSIS — Z950 Presence of cardiac pacemaker: Secondary | ICD-10-CM | POA: Diagnosis not present

## 2023-11-08 DIAGNOSIS — Z0000 Encounter for general adult medical examination without abnormal findings: Secondary | ICD-10-CM | POA: Diagnosis not present

## 2023-11-08 DIAGNOSIS — Z1331 Encounter for screening for depression: Secondary | ICD-10-CM | POA: Diagnosis not present

## 2023-11-08 DIAGNOSIS — M810 Age-related osteoporosis without current pathological fracture: Secondary | ICD-10-CM | POA: Diagnosis not present

## 2024-03-17 DIAGNOSIS — I441 Atrioventricular block, second degree: Secondary | ICD-10-CM | POA: Diagnosis not present

## 2024-05-01 ENCOUNTER — Other Ambulatory Visit: Payer: Self-pay | Admitting: Internal Medicine

## 2024-05-01 DIAGNOSIS — Z1231 Encounter for screening mammogram for malignant neoplasm of breast: Secondary | ICD-10-CM

## 2024-06-16 ENCOUNTER — Encounter

## 2024-06-30 ENCOUNTER — Ambulatory Visit
Admission: RE | Admit: 2024-06-30 | Discharge: 2024-06-30 | Disposition: A | Discharge status: Home / Self Care | Source: Ambulatory Visit | Attending: Internal Medicine | Admitting: Internal Medicine

## 2024-06-30 DIAGNOSIS — Z1231 Encounter for screening mammogram for malignant neoplasm of breast: Secondary | ICD-10-CM | POA: Insufficient documentation
